# Patient Record
Sex: Female | Born: 1960 | Race: White | Hispanic: No | Marital: Married | Smoking: Never smoker
Health system: Southern US, Community
[De-identification: ages and names within clinical notes are randomized; demographics above are authoritative.]

## PROBLEM LIST (undated history)

## (undated) DIAGNOSIS — E785 Hyperlipidemia, unspecified: Secondary | ICD-10-CM

## (undated) DIAGNOSIS — C801 Malignant (primary) neoplasm, unspecified: Secondary | ICD-10-CM

## (undated) DIAGNOSIS — Z8632 Personal history of gestational diabetes: Secondary | ICD-10-CM

## (undated) HISTORY — DX: Hyperlipidemia, unspecified: E78.5

## (undated) HISTORY — PX: VAGINAL HYSTERECTOMY: SUR661

## (undated) HISTORY — DX: Malignant (primary) neoplasm, unspecified: C80.1

## (undated) HISTORY — DX: Personal history of gestational diabetes: Z86.32

## (undated) HISTORY — PX: MOLE REMOVAL: SHX2046

---

## 2001-04-14 ENCOUNTER — Other Ambulatory Visit: Admission: RE | Admit: 2001-04-14 | Discharge: 2001-04-14 | Payer: Self-pay | Admitting: Gynecology

## 2003-05-09 ENCOUNTER — Other Ambulatory Visit: Admission: RE | Admit: 2003-05-09 | Discharge: 2003-05-09 | Payer: Self-pay | Admitting: Gynecology

## 2003-05-17 ENCOUNTER — Encounter: Admission: RE | Admit: 2003-05-17 | Discharge: 2003-05-17 | Payer: Self-pay | Admitting: Gynecology

## 2003-05-17 ENCOUNTER — Encounter: Payer: Self-pay | Admitting: Gynecology

## 2004-05-11 ENCOUNTER — Other Ambulatory Visit: Admission: RE | Admit: 2004-05-11 | Discharge: 2004-05-11 | Payer: Self-pay | Admitting: Gynecology

## 2004-06-01 ENCOUNTER — Encounter: Admission: RE | Admit: 2004-06-01 | Discharge: 2004-06-01 | Payer: Self-pay | Admitting: Gynecology

## 2004-11-27 ENCOUNTER — Ambulatory Visit: Payer: Self-pay | Admitting: Sports Medicine

## 2004-12-01 ENCOUNTER — Ambulatory Visit: Payer: Self-pay | Admitting: Sports Medicine

## 2005-05-31 ENCOUNTER — Other Ambulatory Visit: Admission: RE | Admit: 2005-05-31 | Discharge: 2005-05-31 | Payer: Self-pay | Admitting: Gynecology

## 2005-07-08 ENCOUNTER — Encounter: Admission: RE | Admit: 2005-07-08 | Discharge: 2005-07-08 | Payer: Self-pay | Admitting: Gynecology

## 2006-01-23 HISTORY — PX: LAPAROSCOPIC VAGINAL HYSTERECTOMY: SUR798

## 2006-02-17 ENCOUNTER — Ambulatory Visit (HOSPITAL_COMMUNITY): Admission: RE | Admit: 2006-02-17 | Discharge: 2006-02-18 | Payer: Self-pay | Admitting: Gynecology

## 2006-02-17 ENCOUNTER — Encounter (INDEPENDENT_AMBULATORY_CARE_PROVIDER_SITE_OTHER): Payer: Self-pay | Admitting: Specialist

## 2006-08-02 ENCOUNTER — Encounter: Admission: RE | Admit: 2006-08-02 | Discharge: 2006-08-02 | Payer: Self-pay | Admitting: Gynecology

## 2006-11-29 ENCOUNTER — Other Ambulatory Visit: Admission: RE | Admit: 2006-11-29 | Discharge: 2006-11-29 | Payer: Self-pay | Admitting: Gynecology

## 2007-07-11 ENCOUNTER — Encounter: Admission: RE | Admit: 2007-07-11 | Discharge: 2007-07-25 | Payer: Self-pay | Admitting: Family Medicine

## 2007-11-20 ENCOUNTER — Encounter: Admission: RE | Admit: 2007-11-20 | Discharge: 2007-11-20 | Payer: Self-pay | Admitting: Gynecology

## 2007-12-04 ENCOUNTER — Other Ambulatory Visit: Admission: RE | Admit: 2007-12-04 | Discharge: 2007-12-04 | Payer: Self-pay | Admitting: Gynecology

## 2008-11-21 ENCOUNTER — Encounter: Admission: RE | Admit: 2008-11-21 | Discharge: 2008-11-21 | Payer: Self-pay | Admitting: Gynecology

## 2008-12-09 ENCOUNTER — Encounter: Payer: Self-pay | Admitting: Gynecology

## 2008-12-09 ENCOUNTER — Ambulatory Visit: Payer: Self-pay | Admitting: Gynecology

## 2008-12-09 ENCOUNTER — Other Ambulatory Visit: Admission: RE | Admit: 2008-12-09 | Discharge: 2008-12-09 | Payer: Self-pay | Admitting: Gynecology

## 2009-07-15 ENCOUNTER — Ambulatory Visit: Payer: Self-pay | Admitting: Gynecology

## 2009-11-27 ENCOUNTER — Encounter: Admission: RE | Admit: 2009-11-27 | Discharge: 2009-11-27 | Payer: Self-pay | Admitting: Gynecology

## 2010-01-28 ENCOUNTER — Ambulatory Visit: Payer: Self-pay | Admitting: Gynecology

## 2010-02-05 ENCOUNTER — Other Ambulatory Visit: Admission: RE | Admit: 2010-02-05 | Discharge: 2010-02-05 | Payer: Self-pay | Admitting: Gynecology

## 2010-02-05 ENCOUNTER — Ambulatory Visit: Payer: Self-pay | Admitting: Gynecology

## 2010-02-11 ENCOUNTER — Ambulatory Visit: Payer: Self-pay | Admitting: Gynecology

## 2010-08-16 ENCOUNTER — Encounter: Payer: Self-pay | Admitting: Gynecology

## 2010-12-11 NOTE — H&P (Signed)
Jennifer Dougherty, Jennifer Dougherty             ACCOUNT NO.:  [id number]   MEDICAL RECORD NO.:  [id number]          PATIENT TYPE:  AMB   LOCATION:  SDC                           FACILITY:  [facility]   PHYSICIAN:  Ivor Costa. Farrel Gobble, M.D. DATE OF BIRTH:  [birth year]   DATE OF ADMISSION:  02/17/2006  DATE OF DISCHARGE:                                HISTORY & PHYSICAL   PRINCIPAL DIAGNOSIS:  Symptomatic fibroid uterus.   HISTORY OF PRESENT ILLNESS:  The patient is a 50 year old, gravida 2, para 2  with known multi-fibroid uterus who has been controlled a number of years on  Ortho-Novum 135. However, the patient has been followed by ultrasound for  her fibroids.  The patient presented in May of this year with heavy vaginal  bleeding that was breakthrough bleeding on the birth control pill.  This had  been going on for about two weeks before it was treated with Megace and  stopped.  The ultrasound performed showed persistence of the fibroids.  The  patient has large 6 x 4 x 5 cm fibroid that is on the lower segment, upper  cervical, anteriorly and is causing a lot of symptoms regarding the bladder.  She does not have any incontinence, however.  At this point the patient  would prefer to have definitive surgery.  Overall she has nine fibroids, the  largest of which was mentioned above.  She also has another one that is 8.5  x 3.  The remaining fibroids are between 2 and 3 cm.  Her uterus overall  measures 10 x 6 x 10.  Her adnexa were unremarkable.   OB/GYN HISTORY:  Significant for the fibroids.  She had gestational diabetes  in one of her pregnancies.  She delivered two children vaginally.  She has  been on birth control pills since 1986.  Her Pap smears have been normal.   PAST MEDICAL HISTORY:  Negative.   PAST SURGICAL HISTORY:  Negative.   SOCIAL HISTORY:  She is married.  No alcohol or tobacco.  She does drink  caffeine.  She exercises via walking three to four times a day for 20 to 30  minutes.   She has lost approximately 20 pounds [facility] and has  maintained that weight loss.   FAMILY HISTORY:  Negative for gynecologic cancers.   ALLERGIES:  No known drug allergies.   PHYSICAL EXAMINATION:  GENERAL: Well-appearing in no acute distress.  HEART:  Regular rate.  LUNGS:  Clear to auscultation.  ABDOMEN:  Soft, nontender.  GYN:  She has a parous introitus.  The BUS is negative.  Cervix without  gross lesions.  The uterus is bulky and enlarged. The adnexa were not  palpable.  Rectovaginal exam shows some impingement posteriorly.   ASSESSMENT:  Symptomatic fibroids who now presents for definitive surgery.  The patient will present for a laparoscopic-assisted vaginal hysterectomy.  She was given a prescription for Tylox for postoperative pain.  Risks and  benefits of the procedure were reviewed.  All questions were addressed.      Ivor Costa. Farrel Gobble, M.D.  Electronically Signed  THL/MEDQ  D:  02/16/2006  T:  02/16/2006  Job:  [id number]

## 2010-12-11 NOTE — Op Note (Signed)
NAMESPIRIT, WERNLI             ACCOUNT NO.:  [id number]   MEDICAL RECORD NO.:  [id number]          PATIENT TYPE:  AMB   LOCATION:  SDC                           FACILITY:  [facility]   PHYSICIAN:  Ivor Costa. Farrel Gobble, M.D. DATE OF BIRTH:  August 02, 1960   DATE OF PROCEDURE:  02/17/2006  DATE OF DISCHARGE:                                 OPERATIVE REPORT   PREOPERATIVE DIAGNOSIS:  Symptomatic fibroid uterus.   POSTOPERATIVE DIAGNOSIS:  Symptomatic fibroid uterus.   PROCEDURE:  Laparoscopically assisted vaginal hysterectomy.   SURGEON:  Ivor Costa. Farrel Gobble, M.D.   ASSISTANT:  Gaetano Hawthorne. Lily Peer, M.D.   ANESTHESIA:  General.   IV FLUIDS:  2200 mL lactated Ringer's.   ESTIMATED BLOOD LOSS:  150 mL.   URINE OUTPUT:  50 mL of clear urine.   FINDINGS:  Multifibroid uterus with approximately 6 cm right submucosal  fibroid.  Normal tubes and ovaries.   COMPLICATIONS:  None.   PROCEDURE:  The patient was taken to operating room.  General anesthesia was  induced and placed in dorsal lithotomy position, prepped, draped usual  sterile fashion.  A bivalve speculum was placed in the vagina.  Cervix was  visualized, stabilized with a uterine manipulator.  Gloves were then changed  and attention was turned to the abdomen.  Infraumbilical incision was made  with the scalpel.  Veress needle was inserted.  Opening pressure was 4.  Pneumoperitoneum was created until tympany was appreciated above the liver  after which 10/11 disposable trocar was inserted through the infraumbilical  port.  Placement in the abdomen was confirmed.  The uterus was placed in  Trendelenburg position and two 5 mm ports were placed in the lower segment  using these 5 mm ports, the uterus was deviated towards the right, the round  ligament was visualized, cauterized and then transected with the gyrus.  The  posterior leaf of the broad ligament was similarly treated to until the tubo-  ovarian ligament was reached and similarly  transected and suture ligated.  The dissection was carried through slightly anteriorly as well.  The uterus  was then rotated towards the left and the round posterior broad ligament and  tubo-ovarian ligament were similarly treated.  Visualization on the right  was somewhat obscured secondary to the large fibroid.  From the left-hand  side, the anterior peritoneum was tented up and the bladder flap was  created.  This was brought around to the right-hand side. Visualization on  the right was limited secondary to the fibroid and therefore we decided to  convert the procedure to vaginal.  The legs were elevated with careful  attention to avoid over flexion on the anterior abdominal wall.  The sterile  weighted speculum was placed in the vagina.  Cervix was visualized.  The  uterine manipulator previously placed was removed and a Jacobs tenaculum was  placed.  The vaginal mucosa was then injected with 8 mL of 1% lidocaine with  epi circumferentially and the vagina was then scored circumferentially with  the scalpel.  The vaginal mucosa was then dissected off.  The cervix was  deviated anteriorly and posterior colpotomy was performed sharply.  A long  sterile weighted speculum was then placed in the vagina.  The uterosacral  ligaments were then transected and suture ligated with 0 Vicryl and held for  later identification.  The vaginal mucosa continued to be dissected off.  The cardinal ligaments were then suture transected and suture ligated with 0  Vicryl and this was again done bilaterally.  The uterine vessels were able  to be reached, transected and suture ligated again bilaterally.  The  anterior colpotomy was reached and the long retractor was then placed.  A  few more bites of the uterines were then taken.  It was evident that the  uterus itself had been freed of all pedicles as we had reached the char  marks from above.  The uterus was unable to be delivered, however and  morcellation  ensued and after some coring out of the large fibroid on the  right-hand side, the uterus was able to be delivered and passed off the  field.  That was weighed at 281 grams. A shorter sterile weighted speculum  was placed in the vagina.  The bowels were packed away with moist lap  sponge.  An area of bleeding on the right-hand side was treated with a  figure-of-eight of 0 Vicryl.  The posterior peritoneum was then grasped with  an Allis and held against the posterior vagina.  The peritoneum was then  plicated to the vagina from above the uterosacral to above the uterosacral.  There was some bleeding noted at the uterosacral ligament on the right hand  side which was treated with a figure-of-eight.  The pelvis was irrigated  again.  Hemostasis was assured.  The towel sponge was then removed.  The  vaginal mucosa was grasped and visualized and noted to be hemostatic.  The  vaginal mucosa was then plicated anterior to posterior with 0 Vicryl pop  offs and hemostasis was felt to be secured throughout.  The pneumoperitoneum  was then recreated from above.  The laparoscope was then inserted.  The  pelvis was noted to be hemostatic.  Irrigation ensued and hemostasis was  assured.  The ureters were able then to be visualized better and peristalsis  of both ureters was seen at the end of the procedure.  The instruments were  then removed under direct visualization.  The infraumbilical fascia was  closed with 0 Vicryl.  The skin was closed with 3-0 plain.  The lower ports  were closed with Steri-Strips.  The ports were injected with 10 mL of 0.25%  Marcaine.  The patient tolerated procedure well.  Sponge, lap and needle  counts correct x2.  She was transferred to the PACU in stable condition.      Ivor Costa. Farrel Gobble, M.D.  Electronically Signed     THL/MEDQ  D:  02/17/2006  T:  02/17/2006  Job:  [id number]

## 2010-12-11 NOTE — Discharge Summary (Signed)
NAMESAMAR, VENNEMAN             ACCOUNT NO.:  [id number]   MEDICAL RECORD NO.:  [id number]          PATIENT TYPE:  OIB   LOCATION:  9308                          FACILITY:  [facility]   PHYSICIAN:  Ivor Costa. Farrel Gobble, M.D. DATE OF BIRTH:  08/20/1960   DATE OF ADMISSION:  02/17/2006  DATE OF DISCHARGE:                                 DISCHARGE SUMMARY   PRINCIPAL DIAGNOSIS:  Symptomatic fibroid uterus.   PRINCIPAL PROCEDURE:  Laparoscopic-assisted vaginal hysterectomy.   HOSPITAL COURSE:  The patient presented in the morning of July 26 and  underwent an LAVH for symptomatic fibroid uterus with delivery of a  multifibroid uterus and cervix with an intraoperative weight of 280 grams.  The estimated blood loss was approximately 150 mL.  The patient was then  extubated in the OR and transferred to the PACU and then postop floor in due  fashion.  Her postoperative course was unremarkable.  On the evening of  surgery, the patient was having some nausea; however, she was able  totolerate po and was otherwise without complaints.  By postop day #1, the  patient was feeling well, ambulating, tolerating regular diet and was having  minimal to no vaginal bleeding.  She remained afebrile with vitals stable  throughout.  Her abdomen was soft, nontender.  Her incisions were intact.  Her extremities were nontender.  Her postop labs:  Her hemoglobin was 10.7,  hematocrit was 30.7, her white count was 13.7 and platelets were 277.   DISCHARGE CONDITION:  Stable.   DISCHARGE MEDICATIONS:  The patient will be given Tylox for pain management  preoperatively.  She was also instructed to use over-the-counter Motrin and  Surfak for constipation.   The patient was instructed to follow up in the office in two weeks.      Ivor Costa. Farrel Gobble, M.D.  Electronically Signed     THL/MEDQ  D:  02/18/2006  T:  02/18/2006  Job:  [id number]

## 2011-01-18 ENCOUNTER — Other Ambulatory Visit: Payer: Self-pay | Admitting: Gynecology

## 2011-01-18 DIAGNOSIS — Z1231 Encounter for screening mammogram for malignant neoplasm of breast: Secondary | ICD-10-CM

## 2011-02-10 ENCOUNTER — Ambulatory Visit
Admission: RE | Admit: 2011-02-10 | Discharge: 2011-02-10 | Disposition: A | Discharge status: Home / Self Care | Payer: 59 | Source: Ambulatory Visit | Attending: Gynecology | Admitting: Gynecology

## 2011-02-10 DIAGNOSIS — Z1231 Encounter for screening mammogram for malignant neoplasm of breast: Secondary | ICD-10-CM

## 2011-02-15 ENCOUNTER — Other Ambulatory Visit: Payer: Self-pay

## 2011-02-19 ENCOUNTER — Ambulatory Visit (INDEPENDENT_AMBULATORY_CARE_PROVIDER_SITE_OTHER): Payer: 59 | Admitting: *Deleted

## 2011-02-19 DIAGNOSIS — Z13220 Encounter for screening for lipoid disorders: Secondary | ICD-10-CM

## 2011-02-19 DIAGNOSIS — Z01419 Encounter for gynecological examination (general) (routine) without abnormal findings: Secondary | ICD-10-CM

## 2011-02-20 LAB — COMPREHENSIVE METABOLIC PANEL
ALT: 8 U/L (ref 0–35)
CO2: 25 mEq/L (ref 19–32)
Calcium: 9.6 mg/dL (ref 8.4–10.5)
Chloride: 103 mEq/L (ref 96–112)
Creat: 0.58 mg/dL (ref 0.50–1.10)
Glucose, Bld: 96 mg/dL (ref 70–99)
Total Bilirubin: 0.4 mg/dL (ref 0.3–1.2)
Total Protein: 7.2 g/dL (ref 6.0–8.3)

## 2011-02-25 ENCOUNTER — Encounter: Payer: Self-pay | Admitting: Gynecology

## 2011-02-25 ENCOUNTER — Other Ambulatory Visit (HOSPITAL_COMMUNITY)
Admission: RE | Admit: 2011-02-25 | Discharge: 2011-02-25 | Disposition: A | Discharge status: Home / Self Care | Payer: 59 | Source: Ambulatory Visit | Attending: Gynecology | Admitting: Gynecology

## 2011-02-25 ENCOUNTER — Ambulatory Visit (INDEPENDENT_AMBULATORY_CARE_PROVIDER_SITE_OTHER): Payer: 59 | Admitting: Gynecology

## 2011-02-25 VITALS — BP 110/70 | Ht 65.0 in | Wt 146.0 lb

## 2011-02-25 DIAGNOSIS — R141 Gas pain: Secondary | ICD-10-CM

## 2011-02-25 DIAGNOSIS — Z1211 Encounter for screening for malignant neoplasm of colon: Secondary | ICD-10-CM

## 2011-02-25 DIAGNOSIS — R140 Abdominal distension (gaseous): Secondary | ICD-10-CM

## 2011-02-25 DIAGNOSIS — I8390 Asymptomatic varicose veins of unspecified lower extremity: Secondary | ICD-10-CM

## 2011-02-25 DIAGNOSIS — Z01419 Encounter for gynecological examination (general) (routine) without abnormal findings: Secondary | ICD-10-CM

## 2011-02-25 DIAGNOSIS — N951 Menopausal and female climacteric states: Secondary | ICD-10-CM

## 2011-02-25 NOTE — Progress Notes (Signed)
Addended by: Bertram Savin A on: 02/25/2011 10:06 AM   Modules accepted: Orders

## 2011-02-25 NOTE — Progress Notes (Signed)
Jennifer Dougherty [birth year] [id number]   History:    50 y.o.  for annual exam with several issues to discuss today. She's been complaining of painful lower extremity varicose veins. Also bloating sensation and lower abdomen. She does have a history of an LAVH in the past. Her colonoscopy was done in 2009 which was normal she does have family history of colon cancer. From August to December 2012 she was on Prilosec for GERD and had an upper endoscopy as well. Her last mammogram was July 2004 which was normal. She frequently does result has examination. All her labs were done here on July 27 which consisted of a CBC currently is metabolic panel and lipid profile and urinalysis which were all were normal. She is complaining of occasional vasomotor symptoms.  Past medical history,surgical history, family history and social history were all reviewed and documented in the EPIC chart. ROS:  Was performed and pertinent positives and negatives are included in the history.  Exam: chaperone present Filed Vitals:   02/25/11 0852  BP: 110/70   Body mass index is 24.30 kg/(m^2).  General appearance : Well developed well nourished female. Skin grossly normal HEENT: Neck supple, trachea midline Lungs: Clear to auscultation, no rhonchi or wheezes Heart: Regular rate and rhythm, no murmurs or gallops Breast:Examined in sitting and supine position were symmetrical in appearance, no palpable masses, to skin retraction, no nipple inversion, no nipple discharge and no axillary or supraclavicular lymphadenopathy Abdomen: no palpable masses or tenderness Pelvic  Ext/BUS/vagina  normal   Cervix  normal   Uterus  absent Adnexa  Without masses or tenderness  Anus and perineum  normal Hemoccult testing done  Rectovaginal  normal sphincter tone without palpated masses or tenderness. 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 female for annual exam the patient will have an FSH  drawn today because of her vasomotor symptoms. She'll be scheduled for an ultrasound next week as a result of her symptoms of feeling bloated and lower abdominal discomfort. Will notify her of there is any abnormality on the [facility] and a Hemoccult test and we'll see her back next week. We discussed importance of calcium and vitamin D for osteoporosis prevention.    Ok Edwards MD, 9:39 AM 02/25/2011

## 2011-02-25 NOTE — Patient Instructions (Signed)
Please remember to take her calcium and vitamin D twice a day as discussed in the office visit today. We'll discuss the results of your NEXT WEEK. PLEASE CONTINUE TO DO MONTHLY SELF BREAST EXAMINATION. WE WILL ALSO DISCUSS THE RESULTS OF THE FSH NEXT WEEK AS WELL.

## 2011-03-08 ENCOUNTER — Other Ambulatory Visit: Payer: 59

## 2011-03-08 ENCOUNTER — Ambulatory Visit (INDEPENDENT_AMBULATORY_CARE_PROVIDER_SITE_OTHER): Payer: 59 | Admitting: Gynecology

## 2011-03-08 DIAGNOSIS — R140 Abdominal distension (gaseous): Secondary | ICD-10-CM | POA: Insufficient documentation

## 2011-03-08 DIAGNOSIS — I83893 Varicose veins of bilateral lower extremities with other complications: Secondary | ICD-10-CM

## 2011-03-08 DIAGNOSIS — I83813 Varicose veins of bilateral lower extremities with pain: Secondary | ICD-10-CM | POA: Insufficient documentation

## 2011-03-08 DIAGNOSIS — R141 Gas pain: Secondary | ICD-10-CM

## 2011-03-08 DIAGNOSIS — N949 Unspecified condition associated with female genital organs and menstrual cycle: Secondary | ICD-10-CM

## 2011-03-08 NOTE — Progress Notes (Signed)
Patient presented to the office today to discuss her ultrasound. She was seen in the office on August 2 for her annual gynecological examination. She was complaining of low abdominal bloating so an ultrasound was done today. I was happy to inform her that her ultrasound demonstrated that her ovaries were normal, she has a history of prior hysterectomy. Her recent Pap smear ,mammogram and blood work were all normal. If she continues to have the bloating symptom she'll followup with her gastroenterologist. She does state that she's having normal bowel movements. She was asymptomatic today with the exception of the bilateral lower extremity varicosities for which I had referred her to the vein specialist for further assessment and management because they're causing her discomfort.

## 2011-04-14 ENCOUNTER — Encounter: Payer: Self-pay | Admitting: Gynecology

## 2011-05-04 ENCOUNTER — Encounter: Payer: Self-pay | Admitting: *Deleted

## 2012-01-03 ENCOUNTER — Other Ambulatory Visit: Payer: Self-pay | Admitting: Gynecology

## 2012-01-03 DIAGNOSIS — Z1231 Encounter for screening mammogram for malignant neoplasm of breast: Secondary | ICD-10-CM

## 2012-02-15 ENCOUNTER — Ambulatory Visit: Payer: 59

## 2012-03-13 ENCOUNTER — Encounter: Payer: Self-pay | Admitting: Gynecology

## 2012-03-13 ENCOUNTER — Ambulatory Visit (INDEPENDENT_AMBULATORY_CARE_PROVIDER_SITE_OTHER): Payer: 59

## 2012-03-13 ENCOUNTER — Ambulatory Visit (INDEPENDENT_AMBULATORY_CARE_PROVIDER_SITE_OTHER): Payer: 59 | Admitting: Gynecology

## 2012-03-13 VITALS — BP 112/68 | Ht 65.5 in | Wt 162.0 lb

## 2012-03-13 DIAGNOSIS — Z23 Encounter for immunization: Secondary | ICD-10-CM

## 2012-03-13 DIAGNOSIS — N83209 Unspecified ovarian cyst, unspecified side: Secondary | ICD-10-CM

## 2012-03-13 DIAGNOSIS — N942 Vaginismus: Secondary | ICD-10-CM

## 2012-03-13 DIAGNOSIS — Z8632 Personal history of gestational diabetes: Secondary | ICD-10-CM

## 2012-03-13 DIAGNOSIS — R635 Abnormal weight gain: Secondary | ICD-10-CM

## 2012-03-13 DIAGNOSIS — N949 Unspecified condition associated with female genital organs and menstrual cycle: Secondary | ICD-10-CM

## 2012-03-13 DIAGNOSIS — N831 Corpus luteum cyst of ovary, unspecified side: Secondary | ICD-10-CM

## 2012-03-13 DIAGNOSIS — I8390 Asymptomatic varicose veins of unspecified lower extremity: Secondary | ICD-10-CM

## 2012-03-13 DIAGNOSIS — R1031 Right lower quadrant pain: Secondary | ICD-10-CM

## 2012-03-13 DIAGNOSIS — Z01419 Encounter for gynecological examination (general) (routine) without abnormal findings: Secondary | ICD-10-CM

## 2012-03-13 DIAGNOSIS — Z800 Family history of malignant neoplasm of digestive organs: Secondary | ICD-10-CM | POA: Insufficient documentation

## 2012-03-13 LAB — GLUCOSE, RANDOM: Glucose, Bld: 82 mg/dL (ref 70–99)

## 2012-03-13 LAB — CBC WITH DIFFERENTIAL/PLATELET
Eosinophils Relative: 1 % (ref 0–5)
HCT: 40 % (ref 36.0–46.0)
Hemoglobin: 13.6 g/dL (ref 12.0–15.0)
Lymphocytes Relative: 21 % (ref 12–46)
Lymphs Abs: 2.5 10*3/uL (ref 0.7–4.0)
MCV: 85.3 fL (ref 78.0–100.0)
Monocytes Absolute: 1 10*3/uL (ref 0.1–1.0)
Monocytes Relative: 9 % (ref 3–12)
Neutro Abs: 7.8 10*3/uL — ABNORMAL HIGH (ref 1.7–7.7)
RDW: 13.4 % (ref 11.5–15.5)
WBC: 11.5 10*3/uL — ABNORMAL HIGH (ref 4.0–10.5)

## 2012-03-13 LAB — LIPID PANEL
Cholesterol: 204 mg/dL — ABNORMAL HIGH (ref 0–200)
Total CHOL/HDL Ratio: 2.8 Ratio

## 2012-03-13 LAB — TSH: TSH: 1.394 u[IU]/mL (ref 0.350–4.500)

## 2012-03-13 MED ORDER — FLUCONAZOLE 100 MG PO TABS: ORAL_TABLET | ORAL | Status: DC

## 2012-03-13 NOTE — Progress Notes (Signed)
Jennifer Dougherty August 29, 1960 [id number]   History:    51 y.o.  for annual gyn exam who is only complaint has been her varicose veins was cause her discomfort. Also she's been concerned about weight gain. Review of her retina indicated she had an LAVH several years ago. She does have history of gestational diabetes. Her mother and maternal grandmother both have history of colon cancer and her colonoscopy was normal in 2009. She scheduled for mammogram this week. She does her monthly self was examination. She's currently taking calcium and vitamin D daily. Her last Pap smear was normal in 2012. She does not recall ever having received a deep Vaccine. Review of her record again she was weighing 146 at the 162.  Past medical history,surgical history, family history and social history were all reviewed and documented in the EPIC chart.  Gynecologic History No LMP recorded. Patient has had a hysterectomy. Contraception: Hysterectomy Last Pap: 2012. Results were: normal Last mammogram: 2012. Results were: normal  Obstetric History OB History    Grav Para Term Preterm Abortions TAB SAB Ect Mult Living   2 2        2      # Outc Date GA Lbr Len/2nd Wgt Sex Del Anes PTL Lv   1 PAR            2 PAR                ROS: A ROS was performed and pertinent positives and negatives are included in the history.  GENERAL: No fevers or chills. HEENT: No change in vision, no earache, sore throat or sinus congestion. NECK: No pain or stiffness. CARDIOVASCULAR: No chest pain or pressure. No palpitations. PULMONARY: No shortness of breath, cough or wheeze. GASTROINTESTINAL: No abdominal pain, nausea, vomiting or diarrhea, melena or bright red blood per rectum. GENITOURINARY: No urinary frequency, urgency, hesitancy or dysuria. MUSCULOSKELETAL: No joint or muscle pain, no back pain, no recent trauma. DERMATOLOGIC: No rash, no itching, no lesions. ENDOCRINE: No polyuria, polydipsia, no heat or cold intolerance. No  recent change in weight. HEMATOLOGICAL: No anemia or easy bruising or bleeding. NEUROLOGIC: No headache, seizures, numbness, tingling or weakness. PSYCHIATRIC: No depression, no loss of interest in normal activity or change in sleep pattern.     Exam: chaperone present  BP 112/68  Ht 5' 5.5" (1.664 m)  Wt 162 lb (73.483 kg)  BMI 26.55 kg/m2  Body mass index is 26.55 kg/(m^2).  General appearance : Well developed well nourished female. No acute distress HEENT: Neck supple, trachea midline, no carotid bruits, no thyroidmegaly Lungs: Clear to auscultation, no rhonchi or wheezes, or rib retractions  Heart: Regular rate and rhythm, no murmurs or gallops Breast:Examined in sitting and supine position were symmetrical in appearance, no palpable masses or tenderness,  no skin retraction, no nipple inversion, no nipple discharge, no skin discoloration, no axillary or supraclavicular lymphadenopathy Abdomen: no palpable masses or tenderness, no rebound or guarding Extremities: no edema or skin discoloration or tenderness  Pelvic:  Bartholin, Urethra, Skene Glands: Within normal limits             Vagina: No gross lesions or discharge  Cervix: Absent Uterus  absent  Adnexa  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 Left ovary was otherwise normal no fluid in the cul-de-sac.    Assessment/Plan:  51 y.o. female for annual exam who had an ultrasound as a result of patient's vaginismus demonstrated a small right ovarian cyst measuring 27 x 21 x 20 mm probably corpus luteum cyst. She'll return back to the office in the beginning of the  year for followup ultrasound. Patient did receive the DTaP vaccine today. The following labs were drawn today: CBC, fasting lipid profile, urinalysis, and random blood sugar. New Pap smear screening guidelines discussed. She will no longer needs Pap smears since she's had a hysterectomy and has had normal Pap smears over the course of the past 20 years. She was instructed to continue to do her monthly self breast examination. We discussed importance of calcium and vitamin D for osteoporosis prevention. We discussed importance of weightbearing exercises 45 minutes 3 times a week.   Ok Edwards MD, 10:03 AM 03/13/2012

## 2012-03-13 NOTE — Patient Instructions (Addendum)
Health Maintenance, Females A healthy lifestyle and preventative care can promote health and wellness.  Maintain regular health, dental, and eye exams.   Eat a healthy diet. Foods like vegetables, fruits, whole grains, low-fat dairy products, and lean protein foods contain the nutrients you need without too many calories. Decrease your intake of foods high in solid fats, added sugars, and salt. Get information about a proper diet from your caregiver, if necessary.   Regular physical exercise is one of the most important things you can do for your health. Most adults should get at least 150 minutes of moderate-intensity exercise (any activity that increases your heart rate and causes you to sweat) each week. In addition, most adults need muscle-strengthening exercises on 2 or more days a week.    Maintain a healthy weight. The body mass index (BMI) is a screening tool to identify possible weight problems. It provides an estimate of body fat based on height and weight. Your caregiver can help determine your BMI, and can help you achieve or maintain a healthy weight. For adults 20 years and older:   A BMI below 18.5 is considered underweight.   A BMI of 18.5 to 24.9 is normal.   A BMI of 25 to 29.9 is considered overweight.   A BMI of 30 and above is considered obese.   Maintain normal blood lipids and cholesterol by exercising and minimizing your intake of saturated fat. Eat a balanced diet with plenty of fruits and vegetables. Blood tests for lipids and cholesterol should begin at age 20 and be repeated every 5 years. If your lipid or cholesterol levels are high, you are over 50, or you are a high risk for heart disease, you may need your cholesterol levels checked more frequently.Ongoing high lipid and cholesterol levels should be treated with medicines if diet and exercise are not effective.   If you smoke, find out from your caregiver how to quit. If you do not use tobacco, do not start.    If you are pregnant, do not drink alcohol. If you are breastfeeding, be very cautious about drinking alcohol. If you are not pregnant and choose to drink alcohol, do not exceed 1 drink per day. One drink is considered to be 12 ounces (355 mL) of beer, 5 ounces (148 mL) of wine, or 1.5 ounces (44 mL) of liquor.   Avoid use of street drugs. Do not share needles with anyone. Ask for help if you need support or instructions about stopping the use of drugs.   High blood pressure causes heart disease and increases the risk of stroke. Blood pressure should be checked at least every 1 to 2 years. Ongoing high blood pressure should be treated with medicines, if weight loss and exercise are not effective.   If you are 55 to 49 years old, ask your caregiver if you should take aspirin to prevent strokes.   Diabetes screening involves taking a blood sample to check your fasting blood sugar level. This should be done once every 3 years, after age 45, if you are within normal weight and without risk factors for diabetes. Testing should be considered at a younger age or be carried out more frequently if you are overweight and have at least 1 risk factor for diabetes.   Breast cancer screening is essential preventative care for women. You should practice "breast self-awareness." This means understanding the normal appearance and feel of your breasts and may include breast self-examination. Any changes detected, no matter how   small, should be reported to a caregiver. Women in their 20s and 30s should have a clinical breast exam (CBE) by a caregiver as part of a regular health exam every 1 to 3 years. After age 40, women should have a CBE every year. Starting at age 40, women should consider having a mammogram (breast X-ray) every year. Women who have a family history of breast cancer should talk to their caregiver about genetic screening. Women at a high risk of breast cancer should talk to their caregiver about having  an MRI and a mammogram every year.   The Pap test is a screening test for cervical cancer. Women should have a Pap test starting at age 21. Between ages 21 and 29, Pap tests should be repeated every 2 years. Beginning at age 30, you should have a Pap test every 3 years as long as the past 3 Pap tests have been normal. If you had a hysterectomy for a problem that was not cancer or a condition that could lead to cancer, then you no longer need Pap tests. If you are between ages 65 and 70, and you have had normal Pap tests going back 10 years, you no longer need Pap tests. If you have had past treatment for cervical cancer or a condition that could lead to cancer, you need Pap tests and screening for cancer for at least 20 years after your treatment. If Pap tests have been discontinued, risk factors (such as a new sexual partner) need to be reassessed to determine if screening should be resumed. Some women have medical problems that increase the chance of getting cervical cancer. In these cases, your caregiver may recommend more frequent screening and Pap tests.   The human papillomavirus (HPV) test is an additional test that may be used for cervical cancer screening. The HPV test looks for the virus that can cause the cell changes on the cervix. The cells collected during the Pap test can be tested for HPV. The HPV test could be used to screen women aged 30 years and older, and should be used in women of any age who have unclear Pap test results. After the age of 30, women should have HPV testing at the same frequency as a Pap test.   Colorectal cancer can be detected and often prevented. Most routine colorectal cancer screening begins at the age of 50 and continues through age 75. However, your caregiver may recommend screening at an earlier age if you have risk factors for colon cancer. On a yearly basis, your caregiver may provide home test kits to check for hidden blood in the stool. Use of a small camera at  the end of a tube, to directly examine the colon (sigmoidoscopy or colonoscopy), can detect the earliest forms of colorectal cancer. Talk to your caregiver about this at age 50, when routine screening begins. Direct examination of the colon should be repeated every 5 to 10 years through age 75, unless early forms of pre-cancerous polyps or small growths are found.   Hepatitis C blood testing is recommended for all people born from 1945 through 1965 and any individual with known risks for hepatitis C.   Practice safe sex. Use condoms and avoid high-risk sexual practices to reduce the spread of sexually transmitted infections (STIs). Sexually active women aged 25 and younger should be checked for Chlamydia, which is a common sexually transmitted infection. Older women with new or multiple partners should also be tested for Chlamydia. Testing for other   STIs is recommended if you are sexually active and at increased risk.   Osteoporosis is a disease in which the bones lose minerals and strength with aging. This can result in serious bone fractures. The risk of osteoporosis can be identified using a bone density scan. Women ages 50 and over and women at risk for fractures or osteoporosis should discuss screening with their caregivers. Ask your caregiver whether you should be taking a calcium supplement or vitamin D to reduce the rate of osteoporosis.   Menopause can be associated with physical symptoms and risks. Hormone replacement therapy is available to decrease symptoms and risks. You should talk to your caregiver about whether hormone replacement therapy is right for you.   Use sunscreen with a sun protection factor (SPF) of 30 or greater. Apply sunscreen liberally and repeatedly throughout the day. You should seek shade when your shadow is shorter than you. Protect yourself by wearing long sleeves, pants, a wide-brimmed hat, and sunglasses year round, whenever you are outdoors.   Notify your caregiver  of new moles or changes in moles, especially if there is a change in shape or color. Also notify your caregiver if a mole is larger than the size of a pencil eraser.   Stay current with your immunizations.  Document Released: 01/25/2011 Document Revised: 07/01/2011 Document Reviewed: 01/25/2011 [facility] Patient Information 2012 [place], [place].                                                   Cholesterol Control Diet  Cholesterol levels in your body are determined significantly by your diet. Cholesterol levels may also be related to heart disease. The following material helps to explain this relationship and discusses what you can do to help keep your heart healthy. Not all cholesterol is bad. Low-density lipoprotein (LDL) cholesterol is the "bad" cholesterol. It may cause fatty deposits to build up inside your arteries. High-density lipoprotein (HDL) cholesterol is "good." It helps to remove the "bad" LDL cholesterol from your blood. Cholesterol is a very important risk factor for heart disease. Other risk factors are high blood pressure, smoking, stress, heredity, and weight. The heart muscle gets its supply of blood through the coronary arteries. If your LDL cholesterol is high and your HDL cholesterol is low, you are at risk for having fatty deposits build up in your coronary arteries. This leaves less room through which blood can flow. Without sufficient blood and oxygen, the heart muscle cannot function properly and you may feel chest pains (angina pectoris). When a coronary artery closes up entirely, a part of the heart muscle may die, causing a heart attack (myocardial infarction). CHECKING CHOLESTEROL When your caregiver sends your blood to a lab to be analyzed for cholesterol, a complete lipid (fat) profile may be done. With this test, the total amount of cholesterol and levels of LDL and HDL are determined. Triglycerides are a type of fat that circulates in the blood and can also be used to  determine heart disease risk. The list below describes what the numbers should be: Test: Total Cholesterol.  Less than 200 mg/dl.  Test: LDL "bad cholesterol."  Less than 100 mg/dl.   Less than 70 mg/dl if you are at very high risk of a heart attack or sudden cardiac death.  Test: HDL "good cholesterol."  Greater than 50 mg/dl for  women.   Greater than 40 mg/dl for men.  Test: Triglycerides.  Less than 150 mg/dl.  CONTROLLING CHOLESTEROL WITH DIET Although exercise and lifestyle factors are important, your diet is key. That is because certain foods are known to raise cholesterol and others to lower it. The goal is to balance foods for their effect on cholesterol and more importantly, to replace saturated and trans fat with other types of fat, such as monounsaturated fat, polyunsaturated fat, and omega-3 fatty acids. On average, a person should consume no more than 15 to 17 g of saturated fat daily. Saturated and trans fats are considered "bad" fats, and they will raise LDL cholesterol. Saturated fats are primarily found in animal products such as meats, butter, and cream. However, that does not mean you need to sacrifice all your favorite foods. Today, there are good tasting, low-fat, low-cholesterol substitutes for most of the things you like to eat. Choose low-fat or nonfat alternatives. Choose round or loin cuts of red meat, since these types of cuts are lowest in fat and cholesterol. Chicken (without the skin), fish, veal, and ground [place] breast are excellent choices. Eliminate fatty meats, such as hot dogs and salami. Even shellfish have little or no saturated fat. Have a 3 oz (85 g) portion when you eat lean meat, poultry, or fish. Trans fats are also called "partially hydrogenated oils." They are oils that have been scientifically manipulated so that they are solid at room temperature resulting in a longer shelf life and improved taste and texture of foods in which they are added. Trans  fats are found in stick margarine, some tub margarines, cookies, crackers, and baked goods.  When baking and cooking, oils are an excellent substitute for butter. The monounsaturated oils are especially beneficial since it is believed they lower LDL and raise HDL. The oils you should avoid entirely are saturated tropical oils, such as coconut and palm.  Remember to eat liberally from food groups that are naturally free of saturated and trans fat, including fish, fruit, vegetables, beans, grains (barley, rice, couscous, bulgur wheat), and pasta (without cream sauces).  IDENTIFYING FOODS THAT LOWER CHOLESTEROL  Soluble fiber may lower your cholesterol. This type of fiber is found in fruits such as apples, vegetables such as broccoli, potatoes, and carrots, legumes such as beans, peas, and lentils, and grains such as barley. Foods fortified with plant sterols (phytosterol) may also lower cholesterol. You should eat at least 2 g per day of these foods for a cholesterol lowering effect.  Read package labels to identify low-saturated fats, trans fats free, and low-fat foods at the supermarket. Select cheeses that have only 2 to 3 g saturated fat per ounce. Use a heart-healthy tub margarine that is free of trans fats or partially hydrogenated oil. When buying baked goods (cookies, crackers), avoid partially hydrogenated oils. Breads and muffins should be made from whole grains (whole-wheat or whole oat flour, instead of "flour" or "enriched flour"). Buy non-creamy canned soups with reduced salt and no added fats.  FOOD PREPARATION TECHNIQUES  Never deep-fry. If you must fry, either stir-fry, which uses very little fat, or use non-stick cooking sprays. When possible, broil, bake, or roast meats, and steam vegetables. Instead of dressing vegetables with butter or margarine, use lemon and herbs, applesauce and cinnamon (for squash and sweet potatoes), nonfat yogurt, salsa, and low-fat dressings for salads.    LOW-SATURATED FAT / LOW-FAT FOOD SUBSTITUTES Meats / Saturated Fat (g)  Avoid: Steak, marbled (3 oz/85 g) /  11 g   Choose: Steak, lean (3 oz/85 g) / 4 g   Avoid: Hamburger (3 oz/85 g) / 7 g   Choose: Hamburger, lean (3 oz/85 g) / 5 g   Avoid: Ham (3 oz/85 g) / 6 g   Choose: Ham, lean cut (3 oz/85 g) / 2.4 g   Avoid: Chicken, with skin, dark meat (3 oz/85 g) / 4 g   Choose: Chicken, skin removed, dark meat (3 oz/85 g) / 2 g   Avoid: Chicken, with skin, light meat (3 oz/85 g) / 2.5 g   Choose: Chicken, skin removed, light meat (3 oz/85 g) / 1 g  Dairy / Saturated Fat (g)  Avoid: Whole milk (1 cup) / 5 g   Choose: Low-fat milk, 2% (1 cup) / 3 g   Choose: Low-fat milk, 1% (1 cup) / 1.5 g   Choose: Skim milk (1 cup) / 0.3 g   Avoid: Hard cheese (1 oz/28 g) / 6 g   Choose: Skim milk cheese (1 oz/28 g) / 2 to 3 g   Avoid: Cottage cheese, 4% fat (1 cup) / 6.5 g   Choose: Low-fat cottage cheese, 1% fat (1 cup) / 1.5 g   Avoid: Ice cream (1 cup) / 9 g   Choose: Sherbet (1 cup) / 2.5 g   Choose: Nonfat frozen yogurt (1 cup) / 0.3 g   Choose: Frozen fruit bar / trace   Avoid: Whipped cream (1 tbs) / 3.5 g   Choose: Nondairy whipped topping (1 tbs) / 1 g  Condiments / Saturated Fat (g)  Avoid: Mayonnaise (1 tbs) / 2 g   Choose: Low-fat mayonnaise (1 tbs) / 1 g   Avoid: Butter (1 tbs) / 7 g   Choose: Extra light margarine (1 tbs) / 1 g   Avoid: Coconut oil (1 tbs) / 11.8 g   Choose: Olive oil (1 tbs) / 1.8 g   Choose: Corn oil (1 tbs) / 1.7 g   Choose: Safflower oil (1 tbs) / 1.2 g   Choose: Sunflower oil (1 tbs) / 1.4 g   Choose: Soybean oil (1 tbs) / 2.4 g   Choose: Canola oil (1 tbs) / 1 g  Document Released: 07/12/2005 Document Revised: 03/24/2011 Document Reviewed: 12/31/2010 [facility] Patient Information 2012 [place], [place].  Exercise to Lose Weight Exercise and a healthy diet may help you lose weight. Your doctor may suggest specific  exercises. EXERCISE IDEAS AND TIPS  Choose low-cost things you enjoy doing, such as walking, bicycling, or exercising to workout videos.   Take stairs instead of the elevator.   Walk during your lunch break.   Park your car further away from work or school.   Go to a gym or an exercise class.   Start with 5 to 10 minutes of exercise each day. Build up to 30 minutes of exercise 4 to 6 days a week.   Wear shoes with good support and comfortable clothes.   Stretch before and after working out.   Work out until you breathe harder and your heart beats faster.   Drink extra water when you exercise.   Do not do so much that you hurt yourself, feel dizzy, or get very short of breath.  Exercises that burn about 150 calories:  Running 1  miles in 15 minutes.   Playing volleyball for 45 to 60 minutes.   Washing and waxing a car for 45 to 60 minutes.   Playing touch football  for 45 minutes.   Walking 1  miles in 35 minutes.   Pushing a stroller 1  miles in 30 minutes.   Playing basketball for 30 minutes.   Raking leaves for 30 minutes.   Bicycling 5 miles in 30 minutes.   Walking 2 miles in 30 minutes.   Dancing for 30 minutes.   Shoveling snow for 15 minutes.   Swimming laps for 20 minutes.   Walking up stairs for 15 minutes.   Bicycling 4 miles in 15 minutes.   Gardening for 30 to 45 minutes.   Jumping rope for 15 minutes.   Washing windows or floors for 45 to 60 minutes.  Document Released: 08/14/2010 Document Revised: 03/24/2011 Document Reviewed: 08/14/2010 [facility] Patient Information 2012 [place], [place].    Ovarian Cyst The ovaries are small organs that are on each side of the uterus. The ovaries are the organs that produce the female hormones, estrogen and progesterone. An ovarian cyst is a sac filled with fluid that can vary in its size. It is normal for a small cyst to form in women who are in the childbearing age and who have menstrual periods.  This type of cyst is called a follicle cyst that becomes an ovulation cyst (corpus luteum cyst) after it produces the women's egg. It later goes away on its own if the woman does not become pregnant. There are other kinds of ovarian cysts that may cause problems and may need to be treated. The most serious problem is a cyst with cancer. It should be noted that menopausal women who have an ovarian cyst are at a higher risk of it being a cancer cyst. They should be evaluated very quickly, thoroughly and followed closely. This is especially true in menopausal women because of the high rate of ovarian cancer in women in menopause. CAUSES AND TYPES OF OVARIAN CYSTS:  FUNCTIONAL CYST: The follicle/corpus luteum cyst is a functional cyst that occurs every month during ovulation with the menstrual cycle. They go away with the next menstrual cycle if the woman does not get pregnant. Usually, there are no symptoms with a functional cyst.   ENDOMETRIOMA CYST: This cyst develops from the lining of the uterus tissue. This cyst gets in or on the ovary. It grows every month from the bleeding during the menstrual period. It is also called a "chocolate cyst" because it becomes filled with blood that turns brown. This cyst can cause pain in the lower abdomen during intercourse and with your menstrual period.   CYSTADENOMA CYST: This cyst develops from the cells on the outside of the ovary. They usually are not cancerous. They can get very big and cause lower abdomen pain and pain with intercourse. This type of cyst can twist on itself, cut off its blood supply and cause severe pain. It also can easily rupture and cause a lot of pain.   DERMOID CYST: This type of cyst is sometimes found in both ovaries. They are found to have different kinds of body tissue in the cyst. The tissue includes skin, teeth, hair, and/or cartilage. They usually do not have symptoms unless they get very big. Dermoid cysts are rarely cancerous.    POLYCYSTIC OVARY: This is a rare condition with hormone problems that produces many small cysts on both ovaries. The cysts are follicle-like cysts that never produce an egg and become a corpus luteum. It can cause an increase in body weight, infertility, acne, increase in body and facial hair and lack of  menstrual periods or rare menstrual periods. Many women with this problem develop type 2 diabetes. The exact cause of this problem is unknown. A polycystic ovary is rarely cancerous.   THECA LUTEIN CYST: Occurs when too much hormone (human chorionic gonadotropin) is produced and over-stimulates the ovaries to produce an egg. They are frequently seen when doctors stimulate the ovaries for invitro-fertilization (test tube babies).   LUTEOMA CYST: This cyst is seen during pregnancy. Rarely it can cause an obstruction to the birth canal during labor and delivery. They usually go away after delivery.  SYMPTOMS   Pelvic pain or pressure.   Pain during sexual intercourse.   Increasing girth (swelling) of the abdomen.   Abnormal menstrual periods.   Increasing pain with menstrual periods.   You stop having menstrual periods and you are not pregnant.  DIAGNOSIS  The diagnosis can be made during:  Routine or annual pelvic examination (common).   Ultrasound.   X-ray of the pelvis.   CT Scan.   MRI.   Blood tests.  TREATMENT   Treatment may only be to follow the cyst monthly for 2 to 3 months with your caregiver. Many go away on their own, especially functional cysts.   May be aspirated (drained) with a long needle with ultrasound, or by laparoscopy (inserting a tube into the pelvis through a small incision).   The whole cyst can be removed by laparoscopy.   Sometimes the cyst may need to be removed through an incision in the lower abdomen.   Hormone treatment is sometimes used to help dissolve certain cysts.   Birth control pills are sometimes used to help dissolve certain cysts.   HOME CARE INSTRUCTIONS  Follow your caregiver's advice regarding:  Medicine.   Follow up visits to evaluate and treat the cyst.   You may need to come back or make an appointment with another caregiver, to find the exact cause of your cyst, if your caregiver is not a gynecologist.   Get your yearly and recommended pelvic examinations and Pap tests.   Let your caregiver know if you have had an ovarian cyst in the past.  SEEK MEDICAL CARE IF:   Your periods are late, irregular, they stop, or are painful.   Your stomach (abdomen) or pelvic pain does not go away.   Your stomach becomes larger or swollen.   You have pressure on your bladder or trouble emptying your bladder completely.   You have painful sexual intercourse.   You have feelings of fullness, pressure, or discomfort in your stomach.   You lose weight for no apparent reason.   You feel generally ill.   You become constipated.   You lose your appetite.   You develop acne.   You have an increase in body and facial hair.   You are gaining weight, without changing your exercise and eating habits.   You think you are pregnant.  SEEK IMMEDIATE MEDICAL CARE IF:   You have increasing abdominal pain.   You feel sick to your stomach (nausea) and/or vomit.   You develop a fever that comes on suddenly.   You develop abdominal pain during a bowel movement.   Your menstrual periods become heavier than usual.  Document Released: 07/12/2005 Document Revised: 07/01/2011 Document Reviewed: 05/15/2009 [facility] Patient Information 2012 [place], [place].

## 2012-03-14 LAB — URINALYSIS W MICROSCOPIC + REFLEX CULTURE
Bacteria, UA: NONE SEEN
Bilirubin Urine: NEGATIVE
Casts: NONE SEEN
Crystals: NONE SEEN
Glucose, UA: NEGATIVE mg/dL
Ketones, ur: NEGATIVE mg/dL
Specific Gravity, Urine: 1.005 (ref 1.005–1.030)
Squamous Epithelial / LPF: NONE SEEN
Urobilinogen, UA: 0.2 mg/dL (ref 0.0–1.0)
pH: 7 (ref 5.0–8.0)

## 2012-03-15 ENCOUNTER — Ambulatory Visit
Admission: RE | Admit: 2012-03-15 | Discharge: 2012-03-15 | Disposition: A | Discharge status: Home / Self Care | Payer: 59 | Source: Ambulatory Visit | Attending: Gynecology | Admitting: Gynecology

## 2012-03-15 DIAGNOSIS — Z1231 Encounter for screening mammogram for malignant neoplasm of breast: Secondary | ICD-10-CM

## 2012-03-16 ENCOUNTER — Other Ambulatory Visit: Payer: Self-pay | Admitting: Gynecology

## 2012-03-16 DIAGNOSIS — D72829 Elevated white blood cell count, unspecified: Secondary | ICD-10-CM

## 2012-03-31 ENCOUNTER — Other Ambulatory Visit: Payer: 59

## 2012-03-31 DIAGNOSIS — D72829 Elevated white blood cell count, unspecified: Secondary | ICD-10-CM

## 2012-03-31 LAB — CBC WITH DIFFERENTIAL/PLATELET
Basophils Absolute: 0.1 10*3/uL (ref 0.0–0.1)
Basophils Relative: 0 % (ref 0–1)
Eosinophils Absolute: 0.2 10*3/uL (ref 0.0–0.7)
Hemoglobin: 12.9 g/dL (ref 12.0–15.0)
MCH: 29.5 pg (ref 26.0–34.0)
MCHC: 34.5 g/dL (ref 30.0–36.0)
Monocytes Relative: 7 % (ref 3–12)
Neutro Abs: 7.8 10*3/uL — ABNORMAL HIGH (ref 1.7–7.7)
Neutrophils Relative %: 66 % (ref 43–77)
RDW: 13.1 % (ref 11.5–15.5)

## 2012-04-03 ENCOUNTER — Other Ambulatory Visit: Payer: Self-pay | Admitting: Gynecology

## 2012-04-05 ENCOUNTER — Telehealth: Payer: Self-pay | Admitting: *Deleted

## 2012-04-05 NOTE — Telephone Encounter (Signed)
Message copied by Aura Camps on Wed Apr 05, 2012  3:40 PM ------      Message from: Keenan Bachelor      Created: Wed Apr 05, 2012  2:37 PM       Dr. 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            Patient cannot talk until after 12:30pm each day at [phone].            Thanks!!!            Dedra Skeens

## 2012-04-05 NOTE — Telephone Encounter (Signed)
New patient referral form filled out and faxed to [facility]. Cancer will contact pt for appointment.

## 2012-04-06 ENCOUNTER — Telehealth: Payer: Self-pay | Admitting: Oncology

## 2012-04-06 NOTE — Telephone Encounter (Signed)
C/D on 9/12 for appt 10/01.

## 2012-04-06 NOTE — Telephone Encounter (Signed)
S/W pt in re NP appt 10/1 @ 1:30 w/ Dr. Clelia Croft.  Scheduled per pt she states she will be out of town.  Referring Dr. Lily Peer Dx-Hx of elevated WBC Np packet mail

## 2012-04-07 NOTE — Telephone Encounter (Signed)
Appointment on 04/25/12 @ 1:30 pm

## 2012-04-20 ENCOUNTER — Other Ambulatory Visit: Payer: Self-pay | Admitting: Oncology

## 2012-04-20 DIAGNOSIS — D72829 Elevated white blood cell count, unspecified: Secondary | ICD-10-CM

## 2012-04-24 ENCOUNTER — Telehealth: Payer: Self-pay | Admitting: Oncology

## 2012-04-24 NOTE — Telephone Encounter (Signed)
PT call to r/s NP appt 10/1 to 10/09 @ 1:30 w/ Dr.Shadad.

## 2012-04-25 ENCOUNTER — Ambulatory Visit: Payer: 59

## 2012-04-25 ENCOUNTER — Other Ambulatory Visit: Payer: 59 | Admitting: Lab

## 2012-04-25 ENCOUNTER — Ambulatory Visit: Payer: 59 | Admitting: Oncology

## 2012-04-28 ENCOUNTER — Other Ambulatory Visit: Payer: Self-pay | Admitting: Oncology

## 2012-04-28 DIAGNOSIS — D72829 Elevated white blood cell count, unspecified: Secondary | ICD-10-CM

## 2012-05-03 ENCOUNTER — Other Ambulatory Visit (HOSPITAL_BASED_OUTPATIENT_CLINIC_OR_DEPARTMENT_OTHER): Payer: 59 | Admitting: Lab

## 2012-05-03 ENCOUNTER — Ambulatory Visit: Payer: 59

## 2012-05-03 ENCOUNTER — Ambulatory Visit (HOSPITAL_BASED_OUTPATIENT_CLINIC_OR_DEPARTMENT_OTHER): Payer: 59 | Admitting: Oncology

## 2012-05-03 ENCOUNTER — Telehealth: Payer: Self-pay | Admitting: Oncology

## 2012-05-03 VITALS — BP 139/94 | HR 83 | Temp 97.3°F | Resp 20 | Ht 65.5 in | Wt 161.3 lb

## 2012-05-03 DIAGNOSIS — D72829 Elevated white blood cell count, unspecified: Secondary | ICD-10-CM

## 2012-05-03 LAB — CBC WITH DIFFERENTIAL/PLATELET
BASO%: 0.6 % (ref 0.0–2.0)
LYMPH%: 21.6 % (ref 14.0–49.7)
MCHC: 33 g/dL (ref 31.5–36.0)
MCV: 88.3 fL (ref 79.5–101.0)
MONO%: 7.8 % (ref 0.0–14.0)
Platelets: 341 10*3/uL (ref 145–400)
RBC: 4.27 10*6/uL (ref 3.70–5.45)
RDW: 13.4 % (ref 11.2–14.5)
WBC: 10.9 10*3/uL — ABNORMAL HIGH (ref 3.9–10.3)

## 2012-05-03 LAB — COMPREHENSIVE METABOLIC PANEL (CC13)
ALT: 13 U/L (ref 0–55)
AST: 15 U/L (ref 5–34)
Alkaline Phosphatase: 54 U/L (ref 40–150)
Potassium: 4.1 mEq/L (ref 3.5–5.1)
Sodium: 139 mEq/L (ref 136–145)
Total Bilirubin: 0.3 mg/dL (ref 0.20–1.20)
Total Protein: 7.4 g/dL (ref 6.4–8.3)

## 2012-05-03 NOTE — Progress Notes (Signed)
REASON FOR CONSULTATION:  Leukocytosis.  HISTORY OF PRESENT ILLNESS:  Jennifer Dougherty is a pleasant 51 year old woman, native of [place], currently of [place], had been living around this area since 1985.  This is a pleasant woman without any significant past medical history.  She currently works in the [employer] and has done so for many years at this time.  She sees Dr. Reynaldo Minium for her general OB/GYN need, also for routine physical examinations.  Her last blood counts including a CBC noted that she has more leukocytosis.  Her white cell count on March 31, 2012 was 12.0.  She had a normal hemoglobin, normal platelets, normal differential.  Three weeks prior to that, she had a white cell count of 11.5.  For that reason, the patient was referred to me for evaluation. Clinically she is asymptomatic.  She had not reported any infectious process.  She had not had any recent illnesses or hospitalizations.  She does report stress related to her job and family but nothing really out of the ordinary at this time.  She had not had any seasonal allergies. Has not had any operations or hospitalizations.  She has had previous white cell counts dating back to 2005 that have fluctuated as high as 13,000 and as low as 6000.  REVIEW OF SYSTEMS:  She did not report any headaches, blurry vision, double vision.  She did not report any motor or sensory neuropathy.  She did not report any alteration in mental status, psychiatric issues, depression.  She did not report any fever, chills, sweats.  She did not report any cough, hemoptysis, hematemesis.  No nausea, vomiting, abdominal pain, hematochezia, or melena, genitourinary complaints.  Rest of review of systems unremarkable.  PAST MEDICAL HISTORY:  Really unremarkable.  She does have a history of hypertension, diabetes.  She has had sinus headaches, occasional seasonal allergies.  MEDICATIONS:  She is on calcium and  vitamin D.  ALLERGIES:  None.  SOCIAL HISTORY:  She is married.  She has 2 children.  Denied any alcohol or tobacco abuse.  Her children are 46 and 9 years old, healthy.  She is married.  Currently works as a Lawyer.  FAMILY HISTORY:  Both her mother and father are in good health with a history of hypertension.  She has a history of colon cancer on her mother's side and she gets her routine colonoscopies.  PHYSICAL EXAMINATION:  Alert, awake woman, appeared in no active distress.  Vital signs:  Blood pressure is 139/64, weight is 161, pulse was 97, respirations 20, __________ is 83.  She is afebrile.  ECOG performance status is zero.  HEENT:  Head is normocephalic, atraumatic. Pupils equal, round, reactive to light.  Oral mucosa moist and pink. Neck:  Supple without adenopathy.  Heart:  Regular rate, S1, S2.  Lungs: Clear to auscultation.  Abdomen:  Soft, nontender.  No hepatosplenomegaly.  Extremities:  No edema.  LABORATORY DATA:  Showed a hemoglobin of 12.4, white cell count of 10.9, platelet count 341.  Differential is perfectly normal at this time. Peripheral smear was personally reviewed today and did not really show any evidence of any abnormalities.  White cells including neutrophils and lymphocytes appeared normal in size and shape, no evidence of any dysplasia or immature cells.  She had normal morphology in the red cells and platelets as well.  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  Differential diagnosis discussed today with Mrs. Enke.  I explained to her that major categories include reactive leukocytosis versus primary disorder. I favor reactive leukocytosis in her situation.  I think etiology could be related to seasonal allergy, stress or just a normal variation in her white cell count.   I doubt that this is a primary blood disorder such as myeloproliferative disorder or leukemic process.  At this point her peripheral smear is perfectly healthy at this time, but for completeness sake, I will repeat her white cell count in 6 months and if it is fluctuating around the same range, no further intervention is warranted. All her questions were answered today.    ______________________________ Benjiman Core, M.D. FNS/MEDQ  D:  05/03/2012  T:  05/03/2012  Job:  [id number]

## 2012-05-03 NOTE — Progress Notes (Signed)
Note dictated

## 2012-05-03 NOTE — Telephone Encounter (Signed)
appts made and printed for pt aom °

## 2012-07-28 ENCOUNTER — Encounter: Payer: Self-pay | Admitting: Gynecology

## 2012-07-28 ENCOUNTER — Ambulatory Visit (INDEPENDENT_AMBULATORY_CARE_PROVIDER_SITE_OTHER): Payer: 59

## 2012-07-28 ENCOUNTER — Ambulatory Visit (INDEPENDENT_AMBULATORY_CARE_PROVIDER_SITE_OTHER): Payer: 59 | Admitting: Gynecology

## 2012-07-28 DIAGNOSIS — N83209 Unspecified ovarian cyst, unspecified side: Secondary | ICD-10-CM

## 2012-07-28 DIAGNOSIS — N83201 Unspecified ovarian cyst, right side: Secondary | ICD-10-CM

## 2012-07-28 DIAGNOSIS — N942 Vaginismus: Secondary | ICD-10-CM

## 2012-07-28 DIAGNOSIS — IMO0001 Reserved for inherently not codable concepts without codable children: Secondary | ICD-10-CM

## 2012-07-28 DIAGNOSIS — Z862 Personal history of diseases of the blood and blood-forming organs and certain disorders involving the immune mechanism: Secondary | ICD-10-CM

## 2012-07-28 NOTE — Progress Notes (Signed)
Patient presented to the office today for followup ultrasound. She was seen the office on 03/13/2012 for her annual exam (prior history of LAVH). Because of vaginismus an ultrasound was done that day which demonstrated the following:  Ultrasound today: Right ovary had a 27 x 21 x 20 mm cyst thick wall irregular shaped cystic lumen internal low level echoes with positive color flow the periphery probably corpus luteum cyst. Left ovary was otherwise normal no fluid in the cul-de-sac.   Disappeared been a functional cyst and she was asked to return for followup. The ultrasound today demonstrated the following:  Absent uterus. Left ovary normal. Right ovarian echo-free thick wall a vascular cyst measuring 21 x 16 x 16 mm consistent with corpus luteum cyst. Trace free fluid seen in the cul-de-sac.  Patient otherwise asymptomatic. We discussed that this was possibly a functional cyst which has decreased in size. She will return in 6 months for followup ultrasound. A CA 125 will be drawn today. We discussed the limitations of the CA 125. Since patient is 52 years of age and has history of a sharing razor blades at home we are going to screen her with a hepatitis C blood test. 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 Her last white blood count had been reported at 10.5 and she will followup with a repeat CBC with her hematologist oncologist 6 months from the last visit.

## 2012-07-28 NOTE — Patient Instructions (Addendum)
Ovarian Cyst The ovaries are small organs that are on each side of the uterus. The ovaries are the organs that produce the female hormones, estrogen and progesterone. An ovarian cyst is a sac filled with fluid that can vary in its size. It is normal for a small cyst to form in women who are in the childbearing age and who have menstrual periods. This type of cyst is called a follicle cyst that becomes an ovulation cyst (corpus luteum cyst) after it produces the women's egg. It later goes away on its own if the woman does not become pregnant. There are other kinds of ovarian cysts that may cause problems and may need to be treated. The most serious problem is a cyst with cancer. It should be noted that menopausal women who have an ovarian cyst are at a higher risk of it being a cancer cyst. They should be evaluated very quickly, thoroughly and followed closely. This is especially true in menopausal women because of the high rate of ovarian cancer in women in menopause. CAUSES AND TYPES OF OVARIAN CYSTS:  FUNCTIONAL CYST: The follicle/corpus luteum cyst is a functional cyst that occurs every month during ovulation with the menstrual cycle. They go away with the next menstrual cycle if the woman does not get pregnant. Usually, there are no symptoms with a functional cyst.  ENDOMETRIOMA CYST: This cyst develops from the lining of the uterus tissue. This cyst gets in or on the ovary. It grows every month from the bleeding during the menstrual period. It is also called a "chocolate cyst" because it becomes filled with blood that turns brown. This cyst can cause pain in the lower abdomen during intercourse and with your menstrual period.  CYSTADENOMA CYST: This cyst develops from the cells on the outside of the ovary. They usually are not cancerous. They can get very big and cause lower abdomen pain and pain with intercourse. This type of cyst can twist on itself, cut off its blood supply and cause severe pain. It  also can easily rupture and cause a lot of pain.  DERMOID CYST: This type of cyst is sometimes found in both ovaries. They are found to have different kinds of body tissue in the cyst. The tissue includes skin, teeth, hair, and/or cartilage. They usually do not have symptoms unless they get very big. Dermoid cysts are rarely cancerous.  POLYCYSTIC OVARY: This is a rare condition with hormone problems that produces many small cysts on both ovaries. The cysts are follicle-like cysts that never produce an egg and become a corpus luteum. It can cause an increase in body weight, infertility, acne, increase in body and facial hair and lack of menstrual periods or rare menstrual periods. Many women with this problem develop type 2 diabetes. The exact cause of this problem is unknown. A polycystic ovary is rarely cancerous.  THECA LUTEIN CYST: Occurs when too much hormone (human chorionic gonadotropin) is produced and over-stimulates the ovaries to produce an egg. They are frequently seen when doctors stimulate the ovaries for invitro-fertilization (test tube babies).  LUTEOMA CYST: This cyst is seen during pregnancy. Rarely it can cause an obstruction to the birth canal during labor and delivery. They usually go away after delivery. SYMPTOMS   Pelvic pain or pressure.  Pain during sexual intercourse.  Increasing girth (swelling) of the abdomen.  Abnormal menstrual periods.  Increasing pain with menstrual periods.  You stop having menstrual periods and you are not pregnant. DIAGNOSIS  The diagnosis can   be made during:  Routine or annual pelvic examination (common).  Ultrasound.  X-ray of the pelvis.  CT Scan.  MRI.  Blood tests. TREATMENT   Treatment may only be to follow the cyst monthly for 2 to 3 months with your caregiver. Many go away on their own, especially functional cysts.  May be aspirated (drained) with a long needle with ultrasound, or by laparoscopy (inserting a tube into  the pelvis through a small incision).  The whole cyst can be removed by laparoscopy.  Sometimes the cyst may need to be removed through an incision in the lower abdomen.  Hormone treatment is sometimes used to help dissolve certain cysts.  Birth control pills are sometimes used to help dissolve certain cysts. HOME CARE INSTRUCTIONS  Follow your caregiver's advice regarding:  Medicine.  Follow up visits to evaluate and treat the cyst.  You may need to come back or make an appointment with another caregiver, to find the exact cause of your cyst, if your caregiver is not a gynecologist.  Get your yearly and recommended pelvic examinations and Pap tests.  Let your caregiver know if you have had an ovarian cyst in the past. SEEK MEDICAL CARE IF:   Your periods are late, irregular, they stop, or are painful.  Your stomach (abdomen) or pelvic pain does not go away.  Your stomach becomes larger or swollen.  You have pressure on your bladder or trouble emptying your bladder completely.  You have painful sexual intercourse.  You have feelings of fullness, pressure, or discomfort in your stomach.  You lose weight for no apparent reason.  You feel generally ill.  You become constipated.  You lose your appetite.  You develop acne.  You have an increase in body and facial hair.  You are gaining weight, without changing your exercise and eating habits.  You think you are pregnant. SEEK IMMEDIATE MEDICAL CARE IF:   You have increasing abdominal pain.  You feel sick to your stomach (nausea) and/or vomit.  You develop a fever that comes on suddenly.  You develop abdominal pain during a bowel movement.  Your menstrual periods become heavier than usual. Document Released: 07/12/2005 Document Revised: 10/04/2011 Document Reviewed: 05/15/2009 [facility] Patient Information 2013 [place], [place]. Hepatitis C This is a test to determine if you have contracted the hepatitis C  virus (HCV) and to monitor treatment of the infection. Hepatitis C is a virus that can infect and damage the liver. Hepatitis C antibody is produced in the body in response to exposure to the hepatitis C virus (HCV). The most common test for HCV looks for these antibodies in your blood. Other tests detect the presence of viral RNA, the amount of viral RNA present, or determine the specific subtype of virus. Hepatitis C often leads to chronic hepatitis, which can progress to cirrhosis and liver cancer (hepatocellular carcinoma). Early detection of the virus can alert your doctor to follow your liver function more closely than usual and to consider treating you if you are chronically infected. Anti-HCV tests detect the presence of antibodies to the virus, indicating exposure to HCV. These tests cannot tell if you still have an active viral infection, only that you were exposed to the virus in the past. Usually, the test is reported as "positive" or "negative." There is some evidence that, if your test is "weakly positive," it may not mean that you have been exposed to the HCV virus. The Centers for Disease Control and Prevention (CDC) revised its guidelines  in 2003 and suggests that weakly positive tests be confirmed with the next test before being reported.  HCV RIBA test is an additional test to confirm the presence of antibodies to the virus. In most cases, it can tell if the positive anti-HCV test was due to exposure to HCV (positive RIBA) or represents a false signal (negative RIBA). In a few cases, the results cannot answer this question (indeterminate RIBA). Like the anti-HCV test, the RIBA test cannot tell if you are currently infected, only that you have been exposed to the virus.  HCV-RNA test identifies whether the virus is in your blood, indicating that you have an active infection with HCV. In the past, it was usually performed by a test called a qualitative HCV. Qualitative HCV RNA is reported as a  "positive" or "detected" if any HCV viral RNA is found; otherwise, the report will be "negative" or "not detected". The test may also be used after treatment to see if the virus has been eliminated from the body.  Viral Load or Quantitative HCV tests measure the number of viral RNA particles in your blood. Viral load tests are often used before and during treatment to help determine response to treatment by comparing the amount of virus before and after treatment (usually after 3 months); successful treatment causes a decrease of 99% or more (2 logs) in viral load soon after starting treatment (as early as 4-12 weeks), and usually leads to viral load being not detected. Some newer viral load tests can detect very low amounts of viral RNA, and some laboratories no longer do qualitative HCV RNA tests if they use one of these versions of viral load testing.  Viral genotyping is used to determine the kind, or genotype, of the virus present. There are 6 major types of HCV; the most common (genotype 1) is less likely to respond to treatment than genotypes 2 or 3 and usually requires longer therapy (48 weeks, versus 24 weeks for genotype 2 or 3). Genotyping is often ordered before treatment is started to give an idea of the likelihood of success and how long treatment may be needed.  PREPARATION FOR TEST A blood sample is obtained by inserting a needle into a vein in the arm. NORMAL FINDINGS This test is normally negative. Ranges for normal findings may vary among different laboratories and hospitals. You should always check with your doctor after having lab work or other tests done to discuss the meaning of your test results and whether your values are considered within normal limits. MEANING OF TEST  Your caregiver will go over the test results with you and discuss the importance and meaning of your results, as well as treatment options and the need for additional tests if necessary. OBTAINING THE TEST  RESULTS It is your responsibility to obtain your test results. Ask the lab or department performing the test when and how you will get your results. Document Released: 08/14/2004 Document Revised: 10/04/2011 Document Reviewed: 06/22/2008 [facility] Patient Information 2013 [place], [place].

## 2012-10-31 ENCOUNTER — Telehealth: Payer: Self-pay | Admitting: *Deleted

## 2012-10-31 NOTE — Telephone Encounter (Signed)
Pt called to cancel and r/s appt. gv 11/16/12 aptt. Pt is aware.

## 2012-11-02 ENCOUNTER — Ambulatory Visit: Payer: 59 | Admitting: Oncology

## 2012-11-02 ENCOUNTER — Other Ambulatory Visit: Payer: 59 | Admitting: Lab

## 2012-11-16 ENCOUNTER — Ambulatory Visit (HOSPITAL_BASED_OUTPATIENT_CLINIC_OR_DEPARTMENT_OTHER): Payer: 59 | Admitting: Oncology

## 2012-11-16 ENCOUNTER — Other Ambulatory Visit (HOSPITAL_BASED_OUTPATIENT_CLINIC_OR_DEPARTMENT_OTHER): Payer: 59 | Admitting: Lab

## 2012-11-16 VITALS — BP 148/81 | HR 81 | Temp 98.1°F | Resp 18 | Ht 65.5 in | Wt 159.8 lb

## 2012-11-16 DIAGNOSIS — D72829 Elevated white blood cell count, unspecified: Secondary | ICD-10-CM

## 2012-11-16 LAB — CBC WITH DIFFERENTIAL/PLATELET
BASO%: 0.3 % (ref 0.0–2.0)
EOS%: 1 % (ref 0.0–7.0)
LYMPH%: 22.1 % (ref 14.0–49.7)
MCH: 28.3 pg (ref 25.1–34.0)
MCHC: 32.9 g/dL (ref 31.5–36.0)
MONO#: 1.2 10*3/uL — ABNORMAL HIGH (ref 0.1–0.9)
MONO%: 8.3 % (ref 0.0–14.0)
NEUT%: 68.3 % (ref 38.4–76.8)
Platelets: 330 10*3/uL (ref 145–400)
RBC: 4.41 10*6/uL (ref 3.70–5.45)
WBC: 14 10*3/uL — ABNORMAL HIGH (ref 3.9–10.3)

## 2012-11-16 LAB — CHCC SMEAR

## 2012-11-16 NOTE — Progress Notes (Signed)
Hematology and Oncology Follow Up Visit  Jennifer Dougherty [id number] 1961-04-05 52 y.o. 11/16/2012 4:48 PM Ok Edwards, MDNo ref. provider found   Principle Diagnosis: 52 year old with leukocytosis, likely reactive in nature. Diagnosed in 04/2012 but has been observed since 2004.   Interim History: [name] presents today for a follow up visit. She is a very nice women with long standing history of leukocytosis dating back to 2004. Her WBC has ranged as high as 14 K to normal. She is asymptomatic at this time but report a lot of stress at this time. She had not had any seasonal allergies. Has not had any operations or hospitalizations.    Medications: I have reviewed the patient's current medications. Current outpatient prescriptions:Calcium Carbonate-Vit D-Min (CALTRATE PLUS PO), Take by mouth 2 (two) times daily.  , Disp: , Rfl:   Allergies: No Known Allergies  Past Medical History, Surgical history, Social history, and Family History were reviewed and updated.  Review of Systems: Constitutional:  Negative for fever, chills, night sweats, anorexia, weight loss, pain. Cardiovascular: no chest pain or dyspnea on exertion Respiratory: negative Neurological: negative Dermatological: negative ENT: negative Skin: Negative. Gastrointestinal: negative Genito-Urinary: negative Hematological and Lymphatic: negative Breast: negative Musculoskeletal: negative Remaining ROS negative. Physical Exam: Blood pressure 148/81, pulse 81, temperature 98.1 F (36.7 C), temperature source Oral, resp. rate 18, height 5' 5.5" (1.664 m), weight 159 lb 12.8 oz (72.485 kg). ECOG:  General appearance: alert Head: Normocephalic, without obvious abnormality, atraumatic Neck: no adenopathy, no carotid bruit, no JVD, supple, symmetrical, trachea midline and thyroid not enlarged, symmetric, no tenderness/mass/nodules Lymph nodes: Cervical, supraclavicular, and axillary nodes normal. Heart:regular  rate and rhythm, S1, S2 normal, no murmur, click, rub or gallop Lung:chest clear, no wheezing, rales, normal symmetric air entry Abdomin: soft, non-tender, without masses or organomegaly EXT:no erythema, induration, or nodules   Lab Results: Lab Results  Component Value Date   WBC 14.0* 11/16/2012   HGB 12.5 11/16/2012   HCT 38.0 11/16/2012   MCV 86.1 11/16/2012   PLT 330 11/16/2012     Chemistry      Component Value Date/Time   NA 139 05/03/2012 1353   NA 139 02/19/2011 0939   K 4.1 05/03/2012 1353   K 4.5 02/19/2011 0939   CL 103 05/03/2012 1353   CL 103 02/19/2011 0939   CO2 25 05/03/2012 1353   CO2 25 02/19/2011 0939   BUN 15.0 05/03/2012 1353   BUN 10 02/19/2011 0939   CREATININE 0.7 05/03/2012 1353   CREATININE 0.58 02/19/2011 0939      Component Value Date/Time   CALCIUM 9.8 05/03/2012 1353   CALCIUM 9.6 02/19/2011 0939   ALKPHOS 54 05/03/2012 1353   ALKPHOS 45 02/19/2011 0939   AST 15 05/03/2012 1353   AST 13 02/19/2011 0939   ALT 13 05/03/2012 1353   ALT 8 02/19/2011 0939   BILITOT 0.30 05/03/2012 1353   BILITOT 0.4 02/19/2011 0939       Impression and Plan:  57 -year-old woman with 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   I favor reactive leukocytosis in her situation. I think etiology could be related to seasonal allergy, stress or just a normal variation in her  white cell count.  I doubt that this is a primary blood disorder such as myeloproliferative disorder or leukemic process.  She is up to date from a cancer screening stand point. She had a  CA 125 that was normal.  For now, I do not see any need for routine hematological work up or follow up, but I will be happy to see her as needed.    [facility], MD 4/24/20144:48 PM

## 2013-02-08 ENCOUNTER — Ambulatory Visit: Payer: Self-pay

## 2013-02-08 ENCOUNTER — Ambulatory Visit (INDEPENDENT_AMBULATORY_CARE_PROVIDER_SITE_OTHER): Payer: 59 | Admitting: Gynecology

## 2013-02-08 ENCOUNTER — Other Ambulatory Visit: Payer: Self-pay | Admitting: Gynecology

## 2013-02-08 ENCOUNTER — Ambulatory Visit (INDEPENDENT_AMBULATORY_CARE_PROVIDER_SITE_OTHER): Payer: 59

## 2013-02-08 DIAGNOSIS — N83209 Unspecified ovarian cyst, unspecified side: Secondary | ICD-10-CM

## 2013-02-08 DIAGNOSIS — Z8742 Personal history of other diseases of the female genital tract: Secondary | ICD-10-CM

## 2013-02-08 DIAGNOSIS — Z13220 Encounter for screening for lipoid disorders: Secondary | ICD-10-CM

## 2013-02-08 DIAGNOSIS — R635 Abnormal weight gain: Secondary | ICD-10-CM

## 2013-02-08 DIAGNOSIS — Z01419 Encounter for gynecological examination (general) (routine) without abnormal findings: Secondary | ICD-10-CM

## 2013-02-08 NOTE — Progress Notes (Signed)
Patient is a 52 year old who was seen in the office on 07/28/2012 for a followup ultrasound. Patient previously to that had been seen in the office on 03/13/2012 for her annual exam. Patient with prior history of LAVH. Because of her vaginismus and ultrasound have been ordered which had demonstrated the following:  Right ovary had a 27 x 21 x 20 mm cyst thick wall irregular shaped cystic lumen internal low level echoes with positive color flow the periphery probably corpus luteum cyst. Left ovary was otherwise normal no fluid in the cul-de-sac. This appeared to have been a functional cysts back in 2013 and her followup ultrasound in January this year had demonstrated the cyst was decreasing in size and had the following dimension:  Right ovarian echo-free thick wall a vascular cyst measuring 21 x 16 x 16 mm consistent with corpus luteum cyst. Trace free fluid seen in the cul-de-sac. The patient had a normal CA 125.  Patient is asymptomatic. Ultrasound today: Vaginal cuff is normal. Ovaries appeared to be normal. Trace amount of free fluid seen in the cul-de-sac.  Assessment/plan: Simple functional cyst resolved. Patient asymptomatic. Patient scheduled for annual exam in August of this year. Patient will have her lab work and mammogram the week prior to her office visit.

## 2013-02-23 ENCOUNTER — Other Ambulatory Visit: Payer: Self-pay

## 2013-02-23 DIAGNOSIS — Z1231 Encounter for screening mammogram for malignant neoplasm of breast: Secondary | ICD-10-CM

## 2013-03-14 ENCOUNTER — Encounter: Payer: Self-pay | Admitting: Gynecology

## 2013-03-15 ENCOUNTER — Other Ambulatory Visit: Payer: 59

## 2013-03-15 DIAGNOSIS — Z01419 Encounter for gynecological examination (general) (routine) without abnormal findings: Secondary | ICD-10-CM

## 2013-03-15 DIAGNOSIS — R635 Abnormal weight gain: Secondary | ICD-10-CM

## 2013-03-15 DIAGNOSIS — Z13220 Encounter for screening for lipoid disorders: Secondary | ICD-10-CM

## 2013-03-15 LAB — CBC WITH DIFFERENTIAL/PLATELET
Basophils Absolute: 0 10*3/uL (ref 0.0–0.1)
Basophils Relative: 0 % (ref 0–1)
Eosinophils Absolute: 0.1 10*3/uL (ref 0.0–0.7)
MCH: 28.4 pg (ref 26.0–34.0)
MCHC: 32.9 g/dL (ref 30.0–36.0)
Neutro Abs: 6.2 10*3/uL (ref 1.7–7.7)
Neutrophils Relative %: 65 % (ref 43–77)
Platelets: 350 10*3/uL (ref 150–400)
RDW: 13.9 % (ref 11.5–15.5)

## 2013-03-15 LAB — LIPID PANEL
Total CHOL/HDL Ratio: 2.9 Ratio
VLDL: 17 mg/dL (ref 0–40)

## 2013-03-15 LAB — COMPREHENSIVE METABOLIC PANEL
AST: 13 U/L (ref 0–37)
BUN: 10 mg/dL (ref 6–23)
CO2: 27 mEq/L (ref 19–32)
Calcium: 9.8 mg/dL (ref 8.4–10.5)
Chloride: 101 mEq/L (ref 96–112)
Creat: 0.64 mg/dL (ref 0.50–1.10)

## 2013-03-16 ENCOUNTER — Other Ambulatory Visit: Payer: 59

## 2013-03-16 LAB — URINALYSIS W MICROSCOPIC + REFLEX CULTURE
Casts: NONE SEEN
Crystals: NONE SEEN
Leukocytes, UA: NEGATIVE
Nitrite: NEGATIVE
Specific Gravity, Urine: <1.005 — ABNORMAL LOW (ref 1.005–1.030)
Squamous Epithelial / LPF: NONE SEEN
pH: 7 (ref 5.0–8.0)

## 2013-03-21 ENCOUNTER — Ambulatory Visit
Admission: RE | Admit: 2013-03-21 | Discharge: 2013-03-21 | Disposition: A | Discharge status: Home / Self Care | Payer: 59 | Source: Ambulatory Visit

## 2013-03-21 DIAGNOSIS — Z1231 Encounter for screening mammogram for malignant neoplasm of breast: Secondary | ICD-10-CM

## 2013-03-28 ENCOUNTER — Other Ambulatory Visit: Payer: 59

## 2013-04-04 ENCOUNTER — Encounter: Payer: Self-pay | Admitting: Gynecology

## 2013-04-04 ENCOUNTER — Ambulatory Visit (INDEPENDENT_AMBULATORY_CARE_PROVIDER_SITE_OTHER): Payer: 59 | Admitting: Gynecology

## 2013-04-04 VITALS — BP 126/82 | Ht 65.5 in | Wt 161.0 lb

## 2013-04-04 DIAGNOSIS — Z01419 Encounter for gynecological examination (general) (routine) without abnormal findings: Secondary | ICD-10-CM
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
## 2013-04-04 NOTE — Patient Instructions (Addendum)
Remember to log into [facility] next book for your free menopause info book!  Hormone Therapy At menopause, your body begins making less estrogen and progesterone hormones. This causes the body to stop having menstrual periods. This is because estrogen and progesterone hormones control your periods and menstrual cycle. A lack of estrogen may cause symptoms such as: Hot flushes (or hot flashes). Vaginal dryness. Dry skin. Loss of sex drive. Risk of bone loss (osteoporosis). When this happens, you may choose to take hormone therapy to get back the estrogen lost during menopause. When the hormone estrogen is given alone, it is usually referred to as ET (Estrogen Therapy). When the hormone progestin is combined with estrogen, it is generally called HT (Hormone Therapy). This was formerly known as hormone replacement therapy (HRT). Your caregiver can help you make a decision on what will be best for you. The decision to use HT seems to change often as new studies are done. Many studies do not agree on the benefits of hormone replacement therapy. LIKELY BENEFITS OF HT INCLUDE PROTECTION FROM: Hot Flushes (also called hot flashes) - A hot flush is a sudden feeling of heat that spreads over the face and body. The skin may redden like a blush. It is connected with sweats and sleep disturbance. Women going through menopause may have hot flushes a few times a month or several times per day depending on the woman. Osteoporosis (bone loss)- Estrogen helps guard against bone loss. After menopause, a woman's bones slowly lose calcium and become weak and brittle. As a result, bones are more likely to break. The hip, wrist, and spine are affected most often. Hormone therapy can help slow bone loss after menopause. Weight bearing exercise and taking calcium with vitamin D also can help prevent bone loss. There are also medications that your caregiver can prescribe that can help prevent osteoporosis. Vaginal Dryness - Loss of  estrogen causes changes in the vagina. Its lining may become thin and dry. These changes can cause pain and bleeding during sexual intercourse. Dryness can also lead to infections. This can cause burning and itching. (Vaginal estrogen treatment can help relieve pain, itching, and dryness.) Urinary Tract Infections are more common after menopause because of lack of estrogen. Some women also develop urinary incontinence because of low estrogen levels in the vagina and bladder. Possible other benefits of estrogen include a positive effect on mood and short-term memory in women. RISKS AND COMPLICATIONS Using estrogen alone without progesterone causes the lining of the uterus to grow. This increases the risk of lining of the uterus (endometrial) cancer. Your caregiver should give another hormone called progestin if you have a uterus. Women who take combined (estrogen and progestin) HT appear to have an increased risk of breast cancer. The risk appears to be small, but increases throughout the time that HT is taken. Combined therapy also makes the breast tissue slightly denser which makes it harder to read mammograms (breast X-rays). Combined, estrogen and progesterone therapy can be taken together every day, in which case there may be spotting of blood. HT therapy can be taken cyclically in which case you will have menstrual periods. Cyclically means HT is taken for a set amount of days, then not taken, then this process is repeated. HT may increase the risk of stroke, heart attack, breast cancer and forming blood clots in your leg. Transdermal estrogen (estrogen that is absorbed through the skin with a patch or a cream) may have more positive results with: Cholesterol. Blood  pressure. Blood clots. Having the following conditions may indicate you should not have HT: Endometrial cancer. Liver disease. Breast cancer. Heart disease. History of blood clots. Stroke. TREATMENT  If you choose to take HT and  have a uterus, usually estrogen and progestin are prescribed. Your caregiver will help you decide the best way to take the medications. Possible ways to take estrogen include: Pills. Patches. Gels. Sprays. Vaginal estrogen cream, rings and tablets. It is best to take the lowest dose possible that will help your symptoms and take them for the shortest period of time that you can. Hormone therapy can help relieve some of the problems (symptoms) that affect women at menopause. Before making a decision about HT, talk to your caregiver about what is best for you. Be well informed and comfortable with your decisions. HOME CARE INSTRUCTIONS  Follow your caregivers advice when taking the medications. A Pap test is done to screen for cervical cancer. The first Pap test should be done at age 21. Between ages 22 and 88, Pap tests are repeated every 2 years. Beginning at age 52, you are advised to have a Pap test every 3 years as long as your past 3 Pap tests have been normal. Some women have medical problems that increase the chance of getting cervical cancer. Talk to your caregiver about these problems. It is especially important to talk to your caregiver if a new problem develops soon after your last Pap test. In these cases, your caregiver may recommend more frequent screening and Pap tests. The above recommendations are the same for women who have or have not gotten the vaccine for HPV (Human Papillomavirus). If you had a hysterectomy for a problem that was not a cancer or a condition that could lead to cancer, then you no longer need Pap tests. However, even if you no longer need a Pap test, a regular exam is a good idea to make sure no other problems are starting.  If you are between ages 13 and 75, and you have had normal Pap tests going back 10 years, you no longer need Pap tests. However, even if you no longer need a Pap test, a regular exam is a good idea to make sure no other problems are  starting.  If you have had past treatment for cervical cancer or a condition that could lead to cancer, you need Pap tests and screening for cancer for at least 20 years after your treatment. If Pap tests have been discontinued, risk factors (such as a new sexual partner) need to be re-assessed to determine if screening should be resumed. Some women may need screenings more often if they are at high risk for cervical cancer. Get mammograms done as per the advice of your caregiver. SEEK IMMEDIATE MEDICAL CARE IF: You develop abnormal vaginal bleeding. You have pain or swelling in your legs, shortness of breath, or chest pain. You develop dizziness or headaches. You have lumps or changes in your breasts or armpits. You have slurred speech. You develop weakness or numbness of your arms or legs. You have pain, burning, or bleeding when urinating. You develop abdominal pain. Document Released: 04/10/2003 Document Revised: 10/04/2011 Document Reviewed: 07/29/2010 [facility] Patient Information 2014 [place], [place]. Perimenopause Perimenopause is the time when your body begins to move into the menopause (no menstrual period for 12 straight months). It is a natural process. Perimenopause can begin 2 to 8 years before the menopause and usually lasts for one year after the menopause. During this  time, your ovaries may or may not produce an egg. The ovaries vary in their production of estrogen and progesterone hormones each month. This can cause irregular menstrual periods, difficulty in getting pregnant, vaginal bleeding between periods and uncomfortable symptoms. CAUSES  Irregular production of the ovarian hormones, estrogen and progesterone, and not ovulating every month.  Other causes include:  Tumor of the pituitary gland in the brain.  Medical disease that affects the ovaries.  Radiation treatment.  Chemotherapy.  Unknown causes.  Heavy smoking and excessive alcohol intake can bring on  perimenopause sooner. SYMPTOMS   Hot flashes.  Night sweats.  Irregular menstrual periods.  Decrease sex drive.  Vaginal dryness.  Headaches.  Mood swings.  Depression.  Memory problems.  Irritability.  Tiredness.  Weight gain.  Trouble getting pregnant.  The beginning of losing bone cells (osteoporosis).  The beginning of hardening of the arteries (atherosclerosis). DIAGNOSIS  Your caregiver will make a diagnosis by analyzing your age, menstrual history and your symptoms. They will do a physical exam noting any changes in your body, especially your female organs. Female hormone tests may or may not be helpful depending on the amount and when you produce the female hormones. However, other hormone tests may be helpful (ex. thyroid hormone) to rule out other problems. TREATMENT  The decision to treat during the perimenopause should be made by you and your caregiver depending on how the symptoms are affecting you and your life style. There are various treatments available such as:  Treating individual symptoms with a specific medication for that symptom (ex. tranquilizer for depression).  Herbal medications that can help specific symptoms.  Counseling.  Group therapy.  No treatment. HOME CARE INSTRUCTIONS   Before seeing your caregiver, make a list of your menstrual periods (when the occur, how heavy they are, how long between periods and how long they last), your symptoms and when they started.  Take the medication as recommended by your caregiver.  Sleep and rest.  Exercise.  Eat a diet that contains calcium (good for your bones) and soy (acts like estrogen hormone).  Do not smoke.  Avoid alcoholic beverages.  Taking vitamin E may help in certain cases.  Take calcium and vitamin D supplements to help prevent bone loss.  Group therapy is sometimes helpful.  Acupuncture may help in some cases. SEEK MEDICAL CARE IF:   You have any of the above and  want to know if it is perimenopause.  You want advice and treatment for any of your symptoms mentioned above.  You need a referral to a specialist (gynecologist, psychiatrist or psychologist). SEEK IMMEDIATE MEDICAL CARE IF:   You have vaginal bleeding.  Your period lasts longer than 8 days.  You periods are recurring sooner than 21 days.  You have bleeding after intercourse.  You have severe depression.  You have pain when you urinate.  You have severe headaches.  You develop vision problems. Document Released: 08/19/2004 Document Revised: 10/04/2011 Document Reviewed: 05/09/2008 [facility] Patient Information 2014 [place], [place].

## 2013-04-06 ENCOUNTER — Telehealth: Payer: Self-pay | Admitting: *Deleted

## 2013-04-06 MED ORDER — FLUCONAZOLE 150 MG PO TABS: 150.0000 mg | ORAL_TABLET | Freq: Every day | ORAL | Status: DC | PRN

## 2013-04-06 NOTE — Telephone Encounter (Signed)
Pt forgot to ask you for a Rx for Diflucan to have on hand incase a yeast infection would occur on OV 04/04/13? Pt said this has been done in the past, she does not have infection. Pt said you normally give her 3 tablets of 150 mg. Please advise

## 2013-04-06 NOTE — Telephone Encounter (Signed)
Please call a prescription Diflucan 150 mg 1 by mouth every other day #3

## 2013-04-12 ENCOUNTER — Other Ambulatory Visit: Payer: Self-pay | Admitting: Dermatology

## 2013-05-31 ENCOUNTER — Other Ambulatory Visit: Payer: Self-pay

## 2013-06-15 ENCOUNTER — Encounter: Payer: Self-pay | Admitting: Gynecology

## 2014-03-04 ENCOUNTER — Other Ambulatory Visit: Payer: Self-pay

## 2014-03-04 ENCOUNTER — Telehealth: Payer: Self-pay | Admitting: *Deleted

## 2014-03-04 DIAGNOSIS — Z01419 Encounter for gynecological examination (general) (routine) without abnormal findings: Secondary | ICD-10-CM

## 2014-03-04 DIAGNOSIS — Z1231 Encounter for screening mammogram for malignant neoplasm of breast: Secondary | ICD-10-CM

## 2014-03-04 NOTE — Telephone Encounter (Signed)
Pt has annual scheduled on 04/10/14 would like to have labs done prior to appointment. Please advise

## 2014-03-04 NOTE — Telephone Encounter (Signed)
Orders placed, pt informed. °

## 2014-03-04 NOTE — Telephone Encounter (Signed)
The patient should have the following labs drawn in a fasting state: Fasting lipid profile, CBC, comprehensive metabolic panel, TSH and urinalysis

## 2014-03-26 ENCOUNTER — Other Ambulatory Visit: Payer: 59

## 2014-03-26 DIAGNOSIS — Z01419 Encounter for gynecological examination (general) (routine) without abnormal findings: Secondary | ICD-10-CM

## 2014-03-26 LAB — CBC WITH DIFFERENTIAL/PLATELET
Basophils Absolute: 0 10*3/uL (ref 0.0–0.1)
Basophils Relative: 0 % (ref 0–1)
Eosinophils Absolute: 0.2 10*3/uL (ref 0.0–0.7)
Eosinophils Relative: 2 % (ref 0–5)
HCT: 40 % (ref 36.0–46.0)
HEMOGLOBIN: 13.4 g/dL (ref 12.0–15.0)
LYMPHS ABS: 2.2 10*3/uL (ref 0.7–4.0)
Lymphocytes Relative: 23 % (ref 12–46)
MCH: 28.7 pg (ref 26.0–34.0)
MCHC: 33.5 g/dL (ref 30.0–36.0)
MCV: 85.7 fL (ref 78.0–100.0)
MONOS PCT: 9 % (ref 3–12)
Monocytes Absolute: 0.9 10*3/uL (ref 0.1–1.0)
NEUTROS ABS: 6.3 10*3/uL (ref 1.7–7.7)
NEUTROS PCT: 66 % (ref 43–77)
Platelets: 322 10*3/uL (ref 150–400)
RBC: 4.67 MIL/uL (ref 3.87–5.11)
RDW: 14 % (ref 11.5–15.5)
WBC: 9.5 10*3/uL (ref 4.0–10.5)

## 2014-03-26 LAB — LIPID PANEL
CHOL/HDL RATIO: 2.7 ratio
Cholesterol: 194 mg/dL (ref 0–200)
HDL: 71 mg/dL (ref >39)
LDL CALC: 104 mg/dL — AB (ref 0–99)
Triglycerides: 94 mg/dL (ref <150)
VLDL: 19 mg/dL (ref 0–40)

## 2014-03-26 LAB — COMPREHENSIVE METABOLIC PANEL
ALT: 10 U/L (ref 0–35)
AST: 13 U/L (ref 0–37)
Albumin: 4.3 g/dL (ref 3.5–5.2)
Alkaline Phosphatase: 43 U/L (ref 39–117)
BILIRUBIN TOTAL: 0.6 mg/dL (ref 0.2–1.2)
BUN: 13 mg/dL (ref 6–23)
CO2: 26 meq/L (ref 19–32)
Calcium: 9 mg/dL (ref 8.4–10.5)
Chloride: 101 mEq/L (ref 96–112)
Creat: 0.65 mg/dL (ref 0.50–1.10)
Glucose, Bld: 83 mg/dL (ref 70–99)
POTASSIUM: 4.1 meq/L (ref 3.5–5.3)
SODIUM: 135 meq/L (ref 135–145)
TOTAL PROTEIN: 7 g/dL (ref 6.0–8.3)

## 2014-03-26 LAB — TSH: TSH: 1.638 u[IU]/mL (ref 0.350–4.500)

## 2014-03-27 ENCOUNTER — Ambulatory Visit
Admission: RE | Admit: 2014-03-27 | Discharge: 2014-03-27 | Disposition: A | Discharge status: Home / Self Care | Payer: 59 | Source: Ambulatory Visit

## 2014-03-27 DIAGNOSIS — Z1231 Encounter for screening mammogram for malignant neoplasm of breast: Secondary | ICD-10-CM

## 2014-03-27 LAB — URINALYSIS W MICROSCOPIC + REFLEX CULTURE
BACTERIA UA: NONE SEEN
Bilirubin Urine: NEGATIVE
CASTS: NONE SEEN
Crystals: NONE SEEN
GLUCOSE, UA: NEGATIVE mg/dL
Hgb urine dipstick: NEGATIVE
KETONES UR: NEGATIVE mg/dL
Leukocytes, UA: NEGATIVE
NITRITE: NEGATIVE
Protein, ur: NEGATIVE mg/dL
Specific Gravity, Urine: <1.005 — ABNORMAL LOW (ref 1.005–1.030)
Squamous Epithelial / LPF: NONE SEEN
Urobilinogen, UA: 0.2 mg/dL (ref 0.0–1.0)
pH: 7 (ref 5.0–8.0)

## 2014-04-02 ENCOUNTER — Other Ambulatory Visit: Payer: 59

## 2014-04-10 ENCOUNTER — Encounter: Payer: Self-pay | Admitting: Gynecology

## 2014-04-10 ENCOUNTER — Ambulatory Visit (INDEPENDENT_AMBULATORY_CARE_PROVIDER_SITE_OTHER): Payer: 59

## 2014-04-10 ENCOUNTER — Other Ambulatory Visit: Payer: Self-pay | Admitting: Gynecology

## 2014-04-10 ENCOUNTER — Ambulatory Visit (INDEPENDENT_AMBULATORY_CARE_PROVIDER_SITE_OTHER): Payer: 59 | Admitting: Gynecology

## 2014-04-10 VITALS — BP 118/72 | Ht 65.0 in | Wt 157.6 lb

## 2014-04-10 DIAGNOSIS — R143 Flatulence: Secondary | ICD-10-CM

## 2014-04-10 DIAGNOSIS — R141 Gas pain: Secondary | ICD-10-CM

## 2014-04-10 DIAGNOSIS — M5489 Other dorsalgia: Secondary | ICD-10-CM

## 2014-04-10 DIAGNOSIS — N951 Menopausal and female climacteric states: Secondary | ICD-10-CM

## 2014-04-10 DIAGNOSIS — R140 Abdominal distension (gaseous): Secondary | ICD-10-CM

## 2014-04-10 DIAGNOSIS — M549 Dorsalgia, unspecified: Secondary | ICD-10-CM

## 2014-04-10 DIAGNOSIS — N839 Noninflammatory disorder of ovary, fallopian tube and broad ligament, unspecified: Secondary | ICD-10-CM

## 2014-04-10 DIAGNOSIS — Z23 Encounter for immunization: Secondary | ICD-10-CM

## 2014-04-10 DIAGNOSIS — Z01419 Encounter for gynecological examination (general) (routine) without abnormal findings: Secondary | ICD-10-CM

## 2014-04-10 DIAGNOSIS — N83201 Unspecified ovarian cyst, right side: Secondary | ICD-10-CM | POA: Insufficient documentation

## 2014-04-10 DIAGNOSIS — R142 Eructation: Secondary | ICD-10-CM

## 2014-04-10 DIAGNOSIS — N83209 Unspecified ovarian cyst, unspecified side: Secondary | ICD-10-CM

## 2014-04-10 NOTE — Progress Notes (Signed)
Jennifer Dougherty [birth year] [id number]   History:    53 y.o.  for annual gyn exam with several complaints today. Patient states she's had some low back discomfort and abdominal bloating sensation. Also she has complained of times and feeling her neck feeling swollen. No true difficulty swallowing. Review of her records indicated that she had a laparoscopic assisted vaginal hysterectomy several years ago. Patient's recent labs consisting of CBC, comprehensive metabolic panel, TSH, fasting lipid profile and urinalysis were all normal. Her LDL was slightly elevated at 104. Patient with no previous history of abnormal Pap smears. She received a Tdap vaccine last year and is interested in the flu vaccine this year.  Patient has had periodic colonoscopy in the last one being in 2014 was reported to be normal. Grandmother with colon cancer history mother with colon polyps.  Past medical history,surgical history, family history and social history were all reviewed and documented in the EPIC chart.  Gynecologic History No LMP recorded. Patient has had a hysterectomy. Contraception: status post hysterectomy Last Pap: 2012. Results were: normal Last mammogram: 2014. Results were: normal  Obstetric History OB History  Gravida Para Term Preterm AB SAB TAB Ectopic Multiple Living  2 2        2     # Outcome Date GA Lbr Len/2nd Weight Sex Delivery Anes PTL Lv  2 PAR           1 PAR                ROS: A ROS was performed and pertinent positives and negatives are included in the history.  GENERAL: No fevers or chills. HEENT: No change in vision, no earache, sore throat or sinus congestion. NECK: Complaining of neck fullness . CARDIOVASCULAR: No chest pain or pressure. No palpitations. PULMONARY: No shortness of breath, cough or wheeze. GASTROINTESTINAL: No abdominal pain, nausea, vomiting or diarrhea, melena or bright red blood per rectum. GENITOURINARY: No urinary frequency, urgency, hesitancy or  dysuria. MUSCULOSKELETAL: No joint or muscle pain, no back pain, no recent trauma. DERMATOLOGIC: No rash, no itching, no lesions. ENDOCRINE: No polyuria, polydipsia, no heat or cold intolerance. No recent change in weight. HEMATOLOGICAL: No anemia or easy bruising or bleeding. NEUROLOGIC: No headache, seizures, numbness, tingling or weakness. PSYCHIATRIC: No depression, no loss of interest in normal activity or change in sleep pattern.     Exam: chaperone present  BP 118/72  Ht 5\' 5"  (1.651 m)  Wt 157 lb 9.6 oz (71.487 kg)  BMI 26.23 kg/m2  Body mass index is 26.23 kg/(m^2).  General appearance : Well developed well nourished female. No acute distress HEENT: Neck supple, trachea midline, no carotid bruits, submandibular lymphadenopathy? Lungs: Clear to auscultation, no rhonchi or wheezes, or rib retractions  Heart: Regular rate and rhythm, no murmurs or gallops Breast:Examined in sitting and supine position were symmetrical in appearance, no palpable masses or tenderness,  no skin retraction, no nipple inversion, no nipple discharge, no skin discoloration, no axillary or supraclavicular lymphadenopathy Abdomen: no palpable masses or tenderness, no rebound or guarding Extremities: no edema or skin discoloration or tenderness  Pelvic:  Bartholin, Urethra, Skene Glands: Within normal limits             Vagina: No gross lesions or discharge  Cervix: Absent  Uterus  absent  Adnexa  Without masses or tenderness  Anus and perineum  normal   Rectovaginal  normal sphincter tone without palpated masses or tenderness  Hemoccult will provide cards   Ultrasound today: Absent uterus. Right ovary with thick wall cyst measuring 24 x 20 x 24 mm with internal level echoes and avascular. Left ovary normal. No fluid in the cul-de-sac. Vaginal cuff was intact. Excessive bowel shadowing was noted.    Assessment/Plan:  53 y.o. female for annual exam received the flu vaccine today. Requisition  to obtain her she will vaccine in her local pharmacy was provided. She is perimenopausal and we will be checking her [place] levels today. She was provided with fecal Hemoccult cards assessment to the office for testing. Patient will followup with [facility]. Pap smear was not done today in accordance to the new guidelines. Patient will return back in 3 months for followup ultrasound. This cyst appears to be a small hemorrhagic cyst from time of ovulation. Patient is perimenopausal. We discussed CA 125 exposing cons and she would like to have the blood tests drawn today.  Note: This dictation was prepared with  Dragon/digital dictation along withSmart phrase technology. Any transcriptional errors that result from this process are unintentional.   Terrance Mass MD, 12:19 PM 04/10/2014

## 2014-04-10 NOTE — Patient Instructions (Addendum)
Influenza Virus Vaccine injection (Fluarix) What is this medicine? INFLUENZA VIRUS VACCINE (in floo EN zuh VAHY ruhs vak SEEN) helps to reduce the risk of getting influenza also known as the flu. This medicine may be used for other purposes; ask your health care provider or pharmacist if you have questions. COMMON BRAND NAME(S): Fluarix, Fluzone What should I tell my health care provider before I take this medicine? They need to know if you have any of these conditions: -bleeding disorder like hemophilia -fever or infection -Guillain-Barre syndrome or other neurological problems -immune system problems -infection with the human immunodeficiency virus (HIV) or AIDS -low blood platelet counts -multiple sclerosis -an unusual or allergic reaction to influenza virus vaccine, eggs, chicken proteins, latex, gentamicin, other medicines, foods, dyes or preservatives -pregnant or trying to get pregnant -breast-feeding How should I use this medicine? This vaccine is for injection into a muscle. It is given by a health care professional. A copy of Vaccine Information Statements will be given before each vaccination. Read this sheet carefully each time. The sheet may change frequently. Talk to your pediatrician regarding the use of this medicine in children. Special care may be needed. Overdosage: If you think you have taken too much of this medicine contact a poison control center or emergency room at once. NOTE: This medicine is only for you. Do not share this medicine with others. What if I miss a dose? This does not apply. What may interact with this medicine? -chemotherapy or radiation therapy -medicines that lower your immune system like etanercept, anakinra, infliximab, and adalimumab -medicines that treat or prevent blood clots like warfarin -phenytoin -steroid medicines like prednisone or cortisone -theophylline -vaccines This list may not describe all possible interactions. Give your  health care provider a list of all the medicines, herbs, non-prescription drugs, or dietary supplements you use. Also tell them if you smoke, drink alcohol, or use illegal drugs. Some items may interact with your medicine. What should I watch for while using this medicine? Report any side effects that do not go away within 3 days to your doctor or health care professional. Call your health care provider if any unusual symptoms occur within 6 weeks of receiving this vaccine. You may still catch the flu, but the illness is not usually as bad. You cannot get the flu from the vaccine. The vaccine will not protect against colds or other illnesses that may cause fever. The vaccine is needed every year. What side effects may I notice from receiving this medicine? Side effects that you should report to your doctor or health care professional as soon as possible: -allergic reactions like skin rash, itching or hives, swelling of the face, lips, or tongue Side effects that usually do not require medical attention (report to your doctor or health care professional if they continue or are bothersome): -fever -headache -muscle aches and pains -pain, tenderness, redness, or swelling at site where injected -weak or tired This list may not describe all possible side effects. Call your doctor for medical advice about side effects. You may report side effects to FDA at 1-800-FDA-1088. Where should I keep my medicine? This vaccine is only given in a clinic, pharmacy, doctor's office, or other health care setting and will not be stored at home. NOTE: This sheet is a summary. It may not cover all possible information. If you have questions about this medicine, talk to your doctor, pharmacist, or health care provider.  2015, [employer]vier/[employer] Standard. (2008-02-07 09:30:40) Hormone Therapy At menopause, your  body begins making less estrogen and progesterone hormones. This causes the body to stop having menstrual periods.  This is because estrogen and progesterone hormones control your periods and menstrual cycle. A lack of estrogen may cause symptoms such as:  Hot flushes (or hot flashes).  Vaginal dryness.  Dry skin.  Loss of sex drive.  Risk of bone loss (osteoporosis). When this happens, you may choose to take hormone therapy to get back the estrogen lost during menopause. When the hormone estrogen is given alone, it is usually referred to as ET (Estrogen Therapy). When the hormone progestin is combined with estrogen, it is generally called HT (Hormone Therapy). This was formerly known as hormone replacement therapy (HRT). Your caregiver can help you make a decision on what will be best for you. The decision to use HT seems to change often as new studies are done. Many studies do not agree on the benefits of hormone replacement therapy. LIKELY BENEFITS OF HT INCLUDE PROTECTION FROM:  Hot Flushes (also called hot flashes) - A hot flush is a sudden feeling of heat that spreads over the face and body. The skin may redden like a blush. It is connected with sweats and sleep disturbance. Women going through menopause may have hot flushes a few times a month or several times per day depending on the woman.  Osteoporosis (bone loss)- Estrogen helps guard against bone loss. After menopause, a woman's bones slowly lose calcium and become weak and brittle. As a result, bones are more likely to break. The hip, wrist, and spine are affected most often. Hormone therapy can help slow bone loss after menopause. Weight bearing exercise and taking calcium with vitamin D also can help prevent bone loss. There are also medications that your caregiver can prescribe that can help prevent osteoporosis.  Vaginal Dryness - Loss of estrogen causes changes in the vagina. Its lining may become thin and dry. These changes can cause pain and bleeding during sexual intercourse. Dryness can also lead to infections. This can cause burning and  itching. (Vaginal estrogen treatment can help relieve pain, itching, and dryness.)  Urinary Tract Infections are more common after menopause because of lack of estrogen. Some women also develop urinary incontinence because of low estrogen levels in the vagina and bladder.  Possible other benefits of estrogen include a positive effect on mood and short-term memory in women. RISKS AND COMPLICATIONS  Using estrogen alone without progesterone causes the lining of the uterus to grow. This increases the risk of lining of the uterus (endometrial) cancer. Your caregiver should give another hormone called progestin if you have a uterus.  Women who take combined (estrogen and progestin) HT appear to have an increased risk of breast cancer. The risk appears to be small, but increases throughout the time that HT is taken.  Combined therapy also makes the breast tissue slightly denser which makes it harder to read mammograms (breast X-rays).  Combined, estrogen and progesterone therapy can be taken together every day, in which case there may be spotting of blood. HT therapy can be taken cyclically in which case you will have menstrual periods. Cyclically means HT is taken for a set amount of days, then not taken, then this process is repeated.  HT may increase the risk of stroke, heart attack, breast cancer and forming blood clots in your leg.  Transdermal estrogen (estrogen that is absorbed through the skin with a patch or a cream) may have more positive results with:  Cholesterol.  Blood  pressure.  Blood clots. Having the following conditions may indicate you should not have HT:  Endometrial cancer.  Liver disease.  Breast cancer.  Heart disease.  History of blood clots.  Stroke. TREATMENT   If you choose to take HT and have a uterus, usually estrogen and progestin are prescribed.  Your caregiver will help you decide the best way to take the medications.  Possible ways to take  estrogen include:  Pills.  Patches.  Gels.  Sprays.  Vaginal estrogen cream, rings and tablets.  It is best to take the lowest dose possible that will help your symptoms and take them for the shortest period of time that you can.  Hormone therapy can help relieve some of the problems (symptoms) that affect women at menopause. Before making a decision about HT, talk to your caregiver about what is best for you. Be well informed and comfortable with your decisions. HOME CARE INSTRUCTIONS   Follow your caregivers advice when taking the medications.  A Pap test is done to screen for cervical cancer.  The first Pap test should be done at age 96.  Between ages 41 and 18, Pap tests are repeated every 2 years.  Beginning at age 35, you are advised to have a Pap test every 3 years as long as your past 3 Pap tests have been normal.  Some women have medical problems that increase the chance of getting cervical cancer. Talk to your caregiver about these problems. It is especially important to talk to your caregiver if a new problem develops soon after your last Pap test. In these cases, your caregiver may recommend more frequent screening and Pap tests.  The above recommendations are the same for women who have or have not gotten the vaccine for HPV (Human Papillomavirus).  If you had a hysterectomy for a problem that was not a cancer or a condition that could lead to cancer, then you no longer need Pap tests. However, even if you no longer need a Pap test, a regular exam is a good idea to make sure no other problems are starting.   If you are between ages 3 and 11, and you have had normal Pap tests going back 10 years, you no longer need Pap tests. However, even if you no longer need a Pap test, a regular exam is a good idea to make sure no other problems are starting.   If you have had past treatment for cervical cancer or a condition that could lead to cancer, you need Pap tests and  screening for cancer for at least 20 years after your treatment.  If Pap tests have been discontinued, risk factors (such as a new sexual partner) need to be re-assessed to determine if screening should be resumed.  Some women may need screenings more often if they are at high risk for cervical cancer.  Get mammograms done as per the advice of your caregiver. SEEK IMMEDIATE MEDICAL CARE IF:  You develop abnormal vaginal bleeding.  You have pain or swelling in your legs, shortness of breath, or chest pain.  You develop dizziness or headaches.  You have lumps or changes in your breasts or armpits.  You have slurred speech.  You develop weakness or numbness of your arms or legs.  You have pain, burning, or bleeding when urinating.  You develop abdominal pain. Document Released: 04/10/2003 Document Revised: 10/04/2011 Document Reviewed: 07/29/2010 [facility] Patient Information 2015 [place], [place]. This information is not intended to replace advice given  to you by your health care provider. Make sure you discuss any questions you have with your health care provider. Perimenopause Perimenopause is the time when your body begins to move into the menopause (no menstrual period for 12 straight months). It is a natural process. Perimenopause can begin 2-8 years before the menopause and usually lasts for 1 year after the menopause. During this time, your ovaries may or may not produce an egg. The ovaries vary in their production of estrogen and progesterone hormones each month. This can cause irregular menstrual periods, difficulty getting pregnant, vaginal bleeding between periods, and uncomfortable symptoms. CAUSES  Irregular production of the ovarian hormones, estrogen and progesterone, and not ovulating every month.  Other causes include:  Tumor of the pituitary gland in the brain.  Medical disease that affects the ovaries.  Radiation treatment.  Chemotherapy.  Unknown  causes.  Heavy smoking and excessive alcohol intake can bring on perimenopause sooner. SIGNS AND SYMPTOMS   Hot flashes.  Night sweats.  Irregular menstrual periods.  Decreased sex drive.  Vaginal dryness.  Headaches.  Mood swings.  Depression.  Memory problems.  Irritability.  Tiredness.  Weight gain.  Trouble getting pregnant.  The beginning of losing bone cells (osteoporosis).  The beginning of hardening of the arteries (atherosclerosis). DIAGNOSIS  Your health care provider will make a diagnosis by analyzing your age, menstrual history, and symptoms. He or she will do a physical exam and note any changes in your body, especially your female organs. Female hormone tests may or may not be helpful depending on the amount of female hormones you produce and when you produce them. However, other hormone tests may be helpful to rule out other problems. TREATMENT  In some cases, no treatment is needed. The decision on whether treatment is necessary during the perimenopause should be made by you and your health care provider based on how the symptoms are affecting you and your lifestyle. Various treatments are available, such as:  Treating individual symptoms with a specific medicine for that symptom.  Herbal medicines that can help specific symptoms.  Counseling.  Group therapy. HOME CARE INSTRUCTIONS   Keep track of your menstrual periods (when they occur, how heavy they are, how long between periods, and how long they last) as well as your symptoms and when they started.  Only take over-the-counter or prescription medicines as directed by your health care provider.  Sleep and rest.  Exercise.  Eat a diet that contains calcium (good for your bones) and soy (acts like the estrogen hormone).  Do not smoke.  Avoid alcoholic beverages.  Take vitamin supplements as recommended by your health care provider. Taking vitamin E may help in certain cases.  Take  calcium and vitamin D supplements to help prevent bone loss.  Group therapy is sometimes helpful.  Acupuncture may help in some cases. SEEK MEDICAL CARE IF:   You have questions about any symptoms you are having.  You need a referral to a specialist (gynecologist, psychiatrist, or psychologist). SEEK IMMEDIATE MEDICAL CARE IF:   You have vaginal bleeding.  Your period lasts longer than 8 days.  Your periods are recurring sooner than 21 days.  You have bleeding after intercourse.  You have severe depression.  You have pain when you urinate.  You have severe headaches.  You have vision problems. Document Released: 08/19/2004 Document Revised: 05/02/2013 Document Reviewed: 02/08/2013 [facility] Patient Information 2015 [place], [place]. This information is not intended to replace advice given to you by your  health care provider. Make sure you discuss any questions you have with your health care provider. Ovarian Cyst An ovarian cyst is a fluid-filled sac that forms on an ovary. The ovaries are small organs that produce eggs in women. Various types of cysts can form on the ovaries. Most are not cancerous. Many do not cause problems, and they often go away on their own. Some may cause symptoms and require treatment. Common types of ovarian cysts include:  Functional cysts--These cysts may occur every month during the menstrual cycle. This is normal. The cysts usually go away with the next menstrual cycle if the woman does not get pregnant. Usually, there are no symptoms with a functional cyst.  Endometrioma cysts--These cysts form from the tissue that lines the uterus. They are also called "chocolate cysts" because they become filled with blood that turns brown. This type of cyst can cause pain in the lower abdomen during intercourse and with your menstrual period.  Cystadenoma cysts--This type develops from the cells on the outside of the ovary. These cysts can get very big and cause  lower abdomen pain and pain with intercourse. This type of cyst can twist on itself, cut off its blood supply, and cause severe pain. It can also easily rupture and cause a lot of pain.  Dermoid cysts--This type of cyst is sometimes found in both ovaries. These cysts may contain different kinds of body tissue, such as skin, teeth, hair, or cartilage. They usually do not cause symptoms unless they get very big.  Theca lutein cysts--These cysts occur when too much of a certain hormone (human chorionic gonadotropin) is produced and overstimulates the ovaries to produce an egg. This is most common after procedures used to assist with the conception of a baby (in vitro fertilization). CAUSES   Fertility drugs can cause a condition in which multiple large cysts are formed on the ovaries. This is called ovarian hyperstimulation syndrome.  A condition called polycystic ovary syndrome can cause hormonal imbalances that can lead to nonfunctional ovarian cysts. SIGNS AND SYMPTOMS  Many ovarian cysts do not cause symptoms. If symptoms are present, they may include:  Pelvic pain or pressure.  Pain in the lower abdomen.  Pain during sexual intercourse.  Increasing girth (swelling) of the abdomen.  Abnormal menstrual periods.  Increasing pain with menstrual periods.  Stopping having menstrual periods without being pregnant. DIAGNOSIS  These cysts are commonly found during a routine or annual pelvic exam. Tests may be ordered to find out more about the cyst. These tests may include:  Ultrasound.  X-ray of the pelvis.  CT scan.  MRI.  Blood tests. TREATMENT  Many ovarian cysts go away on their own without treatment. Your health care provider may want to check your cyst regularly for 2-3 months to see if it changes. For women in menopause, it is particularly important to monitor a cyst closely because of the higher rate of ovarian cancer in menopausal women. When treatment is needed, it may  include any of the following:  A procedure to drain the cyst (aspiration). This may be done using a long needle and ultrasound. It can also be done through a laparoscopic procedure. This involves using a thin, lighted tube with a tiny camera on the end (laparoscope) inserted through a small incision.  Surgery to remove the whole cyst. This may be done using laparoscopic surgery or an open surgery involving a larger incision in the lower abdomen.  Hormone treatment or birth control pills. These  methods are sometimes used to help dissolve a cyst. HOME CARE INSTRUCTIONS   Only take over-the-counter or prescription medicines as directed by your health care provider.  Follow up with your health care provider as directed.  Get regular pelvic exams and Pap tests. SEEK MEDICAL CARE IF:   Your periods are late, irregular, or painful, or they stop.  Your pelvic pain or abdominal pain does not go away.  Your abdomen becomes larger or swollen.  You have pressure on your bladder or trouble emptying your bladder completely.  You have pain during sexual intercourse.  You have feelings of fullness, pressure, or discomfort in your stomach.  You lose weight for no apparent reason.  You feel generally ill.  You become constipated.  You lose your appetite.  You develop acne.  You have an increase in body and facial hair.  You are gaining weight, without changing your exercise and eating habits.  You think you are pregnant. SEEK IMMEDIATE MEDICAL CARE IF:   You have increasing abdominal pain.  You feel sick to your stomach (nauseous), and you throw up (vomit).  You develop a fever that comes on suddenly.  You have abdominal pain during a bowel movement.  Your menstrual periods become heavier than usual. MAKE SURE YOU:  Understand these instructions.  Will watch your condition.  Will get help right away if you are not doing well or get worse. Document Released: 07/12/2005  Document Revised: 07/17/2013 Document Reviewed: 03/19/2013 [facility] Patient Information 2015 [place], [place]. This information is not intended to replace advice given to you by your health care provider. Make sure you discuss any questions you have with your health care provider.

## 2014-04-11 LAB — CA 125: CA 125: 4 U/mL (ref <35)

## 2014-04-11 LAB — FOLLICLE STIMULATING HORMONE: FSH: 14.7 m[IU]/mL

## 2014-05-27 ENCOUNTER — Encounter: Payer: Self-pay | Admitting: Gynecology

## 2014-07-10 ENCOUNTER — Other Ambulatory Visit: Payer: 59

## 2014-07-10 ENCOUNTER — Ambulatory Visit: Payer: 59 | Admitting: Gynecology

## 2014-07-31 ENCOUNTER — Other Ambulatory Visit: Payer: Self-pay | Admitting: Gynecology

## 2014-07-31 ENCOUNTER — Ambulatory Visit (INDEPENDENT_AMBULATORY_CARE_PROVIDER_SITE_OTHER): Payer: 59 | Admitting: Gynecology

## 2014-07-31 ENCOUNTER — Encounter: Payer: Self-pay | Admitting: Gynecology

## 2014-07-31 ENCOUNTER — Ambulatory Visit (INDEPENDENT_AMBULATORY_CARE_PROVIDER_SITE_OTHER): Payer: 59

## 2014-07-31 DIAGNOSIS — R599 Enlarged lymph nodes, unspecified: Secondary | ICD-10-CM

## 2014-07-31 DIAGNOSIS — N83202 Unspecified ovarian cyst, left side: Secondary | ICD-10-CM

## 2014-07-31 DIAGNOSIS — N8320 Unspecified ovarian cysts: Secondary | ICD-10-CM

## 2014-07-31 DIAGNOSIS — R188 Other ascites: Secondary | ICD-10-CM

## 2014-07-31 DIAGNOSIS — N83201 Unspecified ovarian cyst, right side: Secondary | ICD-10-CM

## 2014-07-31 DIAGNOSIS — R591 Generalized enlarged lymph nodes: Secondary | ICD-10-CM

## 2014-07-31 NOTE — Progress Notes (Signed)
   Patient presented to the office today for her 4 month follow-up ultrasound. Patient was seen in the office September 2015 complaining of some low back discomfort and abdominal bloating sensation. She also complained at times a few her next feeling swollen but no difficulty in swallowing. She was found to have bilateral submandibular lymphadenopathy and was referred to the ENT but did not follow through as of yet.  Ultrasound done 04/10/2014 demonstrated the following: Ultrasound today: Absent uterus. Right ovary with thick wall cyst measuring 24 x 20 x 24 mm with internal level echoes and avascular. Left ovary normal. No fluid in the cul-de-sac. Vaginal cuff was intact. Excessive bowel shadowing was noted.  Patient had a normal C1 25 with a value of 4.  Ultrasound today: Previously seen hemorrhagic cysts resolve. There was some fluid noted in the cul-de-sac. A thin wall left ovarian cyst with layering and debris measuring 2.3 x 1.0 x 2.0 cm with color flow Doppler present the periphery appears to be a hemorrhagic cyst on the contralateral ovary.  Assessment/plan: Patient with ovulatory hemorrhagic cysts alternating sides. Normal CA 125. Patient scheduled to return in September for annual exam will follow-up with an ultrasound. Patient continues with submandibular lymphadenopathy and it was stressed upon her to follow-up with the ENT and she will make an appointment the next week.

## 2014-12-24 ENCOUNTER — Other Ambulatory Visit: Payer: Self-pay | Admitting: Gynecology

## 2014-12-24 NOTE — Telephone Encounter (Signed)
See 04/06/13 telephone encounter.

## 2015-02-27 ENCOUNTER — Telehealth: Payer: Self-pay | Admitting: *Deleted

## 2015-02-27 DIAGNOSIS — Z01419 Encounter for gynecological examination (general) (routine) without abnormal findings: Secondary | ICD-10-CM

## 2015-02-27 NOTE — Telephone Encounter (Signed)
Pt has annual scheduled on 04/23/15 wants labs done prior to annual. Please advise

## 2015-02-27 NOTE — Telephone Encounter (Signed)
Fasting CBC/LP,TSH,U/A/CMET annual screen

## 2015-02-27 NOTE — Telephone Encounter (Signed)
Pt informed with the below note, orders placed.

## 2015-03-20 ENCOUNTER — Other Ambulatory Visit: Payer: 59

## 2015-03-20 DIAGNOSIS — Z01419 Encounter for gynecological examination (general) (routine) without abnormal findings: Secondary | ICD-10-CM

## 2015-03-20 LAB — COMPREHENSIVE METABOLIC PANEL
ALBUMIN: 4.3 g/dL (ref 3.6–5.1)
ALK PHOS: 46 U/L (ref 33–130)
ALT: 11 U/L (ref 6–29)
AST: 14 U/L (ref 10–35)
BILIRUBIN TOTAL: 0.5 mg/dL (ref 0.2–1.2)
BUN: 10 mg/dL (ref 7–25)
CALCIUM: 9.2 mg/dL (ref 8.6–10.4)
CO2: 25 mmol/L (ref 20–31)
Chloride: 102 mmol/L (ref 98–110)
Creat: 0.71 mg/dL (ref 0.50–1.05)
Glucose, Bld: 85 mg/dL (ref 65–99)
Potassium: 4.6 mmol/L (ref 3.5–5.3)
Sodium: 138 mmol/L (ref 135–146)
Total Protein: 7 g/dL (ref 6.1–8.1)

## 2015-03-20 LAB — LIPID PANEL
Cholesterol: 210 mg/dL — ABNORMAL HIGH (ref 125–200)
HDL: 80 mg/dL (ref >=46)
LDL CALC: 113 mg/dL (ref <130)
Total CHOL/HDL Ratio: 2.6 Ratio (ref <=5.0)
Triglycerides: 84 mg/dL (ref <150)
VLDL: 17 mg/dL (ref <30)

## 2015-03-20 LAB — CBC WITH DIFFERENTIAL/PLATELET
BASOS PCT: 0 % (ref 0–1)
Basophils Absolute: 0 10*3/uL (ref 0.0–0.1)
Eosinophils Absolute: 0.1 10*3/uL (ref 0.0–0.7)
Eosinophils Relative: 1 % (ref 0–5)
HCT: 40 % (ref 36.0–46.0)
Hemoglobin: 13.3 g/dL (ref 12.0–15.0)
Lymphocytes Relative: 26 % (ref 12–46)
Lymphs Abs: 2.5 10*3/uL (ref 0.7–4.0)
MCH: 29 pg (ref 26.0–34.0)
MCHC: 33.3 g/dL (ref 30.0–36.0)
MCV: 87.1 fL (ref 78.0–100.0)
MONO ABS: 0.9 10*3/uL (ref 0.1–1.0)
MPV: 11.1 fL (ref 8.6–12.4)
Monocytes Relative: 9 % (ref 3–12)
NEUTROS ABS: 6.1 10*3/uL (ref 1.7–7.7)
NEUTROS PCT: 64 % (ref 43–77)
Platelets: 362 10*3/uL (ref 150–400)
RBC: 4.59 MIL/uL (ref 3.87–5.11)
RDW: 13.7 % (ref 11.5–15.5)
WBC: 9.5 10*3/uL (ref 4.0–10.5)

## 2015-03-20 LAB — TSH: TSH: 1.707 u[IU]/mL (ref 0.350–4.500)

## 2015-03-21 LAB — URINALYSIS W MICROSCOPIC + REFLEX CULTURE
Bacteria, UA: NONE SEEN [HPF]
Bilirubin Urine: NEGATIVE
CASTS: NONE SEEN [LPF]
Crystals: NONE SEEN [HPF]
Glucose, UA: NEGATIVE
Hgb urine dipstick: NEGATIVE
Ketones, ur: NEGATIVE
LEUKOCYTES UA: NEGATIVE
Nitrite: NEGATIVE
Protein, ur: NEGATIVE
RBC / HPF: NONE SEEN RBC/HPF (ref <=2)
SPECIFIC GRAVITY, URINE: 1.003 (ref 1.001–1.035)
Squamous Epithelial / LPF: NONE SEEN [HPF] (ref <=5)
WBC, UA: NONE SEEN WBC/HPF (ref <=5)
Yeast: NONE SEEN [HPF]
pH: 7.5 (ref 5.0–8.0)

## 2015-04-10 ENCOUNTER — Other Ambulatory Visit: Payer: Self-pay | Admitting: Gynecology

## 2015-04-10 DIAGNOSIS — N83202 Unspecified ovarian cyst, left side: Secondary | ICD-10-CM

## 2015-04-11 ENCOUNTER — Telehealth: Payer: Self-pay | Admitting: Gynecology

## 2015-04-11 NOTE — Telephone Encounter (Signed)
04/11/15-Pt had requested that I check to see if her [facility] ins would cover an [id number] per [employer] on the same day as her annual gyn exam with [facility] [phone]. They were unable to guarantee payment for both on same day as u/s is follow up for dx N83.20 and not preventative service. I LM for pt on cell that we would have her follow our normal protocol of signing a waiver to be responsible for the cost of the u/s if it is denied or she can make a separate appt.wl

## 2015-04-23 ENCOUNTER — Ambulatory Visit (INDEPENDENT_AMBULATORY_CARE_PROVIDER_SITE_OTHER): Payer: 59

## 2015-04-23 ENCOUNTER — Encounter: Payer: 59 | Admitting: Gynecology

## 2015-04-23 ENCOUNTER — Other Ambulatory Visit: Payer: 59

## 2015-04-23 ENCOUNTER — Ambulatory Visit (INDEPENDENT_AMBULATORY_CARE_PROVIDER_SITE_OTHER): Payer: 59 | Admitting: Gynecology

## 2015-04-23 ENCOUNTER — Encounter: Payer: Self-pay | Admitting: Gynecology

## 2015-04-23 VITALS — BP 128/76 | Ht 65.25 in | Wt 167.0 lb

## 2015-04-23 DIAGNOSIS — Z780 Asymptomatic menopausal state: Secondary | ICD-10-CM

## 2015-04-23 DIAGNOSIS — Z23 Encounter for immunization: Secondary | ICD-10-CM

## 2015-04-23 DIAGNOSIS — N8320 Unspecified ovarian cysts: Secondary | ICD-10-CM | POA: Diagnosis not present

## 2015-04-23 DIAGNOSIS — Z01419 Encounter for gynecological examination (general) (routine) without abnormal findings: Secondary | ICD-10-CM | POA: Diagnosis not present

## 2015-04-23 DIAGNOSIS — Z8742 Personal history of other diseases of the female genital tract: Secondary | ICD-10-CM

## 2015-04-23 DIAGNOSIS — N83202 Unspecified ovarian cyst, left side: Secondary | ICD-10-CM

## 2015-04-23 MED ORDER — FLUCONAZOLE 150 MG PO TABS: 150.0000 mg | ORAL_TABLET | Freq: Once | ORAL | Status: DC

## 2015-04-23 MED ORDER — PAROXETINE HCL 10 MG PO TABS: 10.0000 mg | ORAL_TABLET | Freq: Every day | ORAL | Status: DC

## 2015-04-23 NOTE — Patient Instructions (Signed)
Paroxetine tablets What is this medicine? PAROXETINE (pa ROX e teen) is used to treat depression. It Tyianna Menefee also be used to treat anxiety disorders, obsessive compulsive disorder, panic attacks, post traumatic stress, and premenstrual dysphoric disorder (PMDD). This medicine Pennye Beeghly be used for other purposes; ask your health care provider or pharmacist if you have questions. COMMON BRAND NAME(S): Paxil, Pexeva What should I tell my health care provider before I take this medicine? They need to know if you have any of these conditions: -bleeding disorders -glaucoma -heart disease -kidney disease -liver disease -low levels of sodium in the blood -mania or bipolar disorder -seizures -suicidal thoughts, plans, or attempt; a previous suicide attempt by you or a family member -take MAOIs like Carbex, Eldepryl, Marplan, Nardil, and Parnate -take medicines that treat or prevent blood clots -an unusual or allergic reaction to paroxetine, other medicines, foods, dyes, or preservatives -pregnant or trying to get pregnant -breast-feeding How should I use this medicine? Take this medicine by mouth with a glass of water. Follow the directions on the prescription label. You can take it with or without food. Take your medicine at regular intervals. Do not take your medicine more often than directed. Do not stop taking this medicine suddenly except upon the advice of your doctor. Stopping this medicine too quickly Avyanna Spada cause serious side effects or your condition Uilani Sanville worsen. A special MedGuide will be given to you by the pharmacist with each prescription and refill. Be sure to read this information carefully each time. Talk to your pediatrician regarding the use of this medicine in children. Special care Emmilynn Marut be needed. Overdosage: If you think you have taken too much of this medicine contact a poison control center or emergency room at once. NOTE: This medicine is only for you. Do not share this medicine with  others. What if I miss a dose? If you miss a dose, take it as soon as you can. If it is almost time for your next dose, take only that dose. Do not take double or extra doses. What Daphanie Oquendo interact with this medicine? Do not take this medicine with any of the following medications: -linezolid -MAOIs like Carbex, Eldepryl, Marplan, Nardil, and Parnate -methylene blue (injected into a vein) -pimozide -thioridazine This medicine Abbie Berling also interact with the following medications: -alcohol -aspirin and aspirin-like medicines -atomoxetine -certain medicines for depression, anxiety, or psychotic disturbances -certain medicines for irregular heart beat like propafenone, flecainide, encainide, and quinidine -certain medicines for migraine headache like almotriptan, eletriptan, frovatriptan, naratriptan, rizatriptan, sumatriptan, zolmitriptan -cimetidine -digoxin -diuretics -fentanyl -fosamprenavir/ritonavir -furazolidone -isoniazid -lithium -medicines that treat or prevent blood clots like warfarin, enoxaparin, and dalteparin -medicines for sleep -metoprolol -NSAIDs, medicines for pain and inflammation, like ibuprofen or naproxen -phenobarbital -phenytoin -procarbazine -procyclidine -rasagiline -supplements like St. John's wort, kava kava, valerian -tamoxifen -theophylline -tramadol -tryptophan This list Ramy Greth not describe all possible interactions. Give your health care provider a list of all the medicines, herbs, non-prescription drugs, or dietary supplements you use. Also tell them if you smoke, drink alcohol, or use illegal drugs. Some items Lorane Cousar interact with your medicine. What should I watch for while using this medicine? Tell your doctor if your symptoms do not get better or if they get worse. Visit your doctor or health care professional for regular checks on your progress. Because it Brayden Brodhead take several weeks to see the full effects of this medicine, it is important to continue your  treatment as prescribed by your doctor. Patients and their families should watch   out for new or worsening thoughts of suicide or depression. Also watch out for sudden changes in feelings such as feeling anxious, agitated, panicky, irritable, hostile, aggressive, impulsive, severely restless, overly excited and hyperactive, or not being able to sleep. If this happens, especially at the beginning of treatment or after a change in dose, call your health care professional. You Cooper Moroney get drowsy or dizzy. Do not drive, use machinery, or do anything that needs mental alertness until you know how this medicine affects you. Do not stand or sit up quickly, especially if you are an older patient. This reduces the risk of dizzy or fainting spells. Alcohol Jeniffer Culliver interfere with the effect of this medicine. Avoid alcoholic drinks. Your mouth Mccall Lomax get dry. Chewing sugarless gum or sucking hard candy, and drinking plenty of water will help. Contact your doctor if the problem does not go away or is severe. What side effects Aharon Carriere I notice from receiving this medicine? Side effects that you should report to your doctor or health care professional as soon as possible: -allergic reactions like skin rash, itching or hives, swelling of the face, lips, or tongue -black or bloody stools, blood in the urine or vomit -fast, irregular heartbeat -hallucination, loss of contact with reality -painful or prolonged erection (men) -seizures -suicidal thoughts or other mood changes -trouble passing urine or change in the amount of urine -unusual bleeding or bruising -unusually weak or tired -vomiting Side effects that usually do not require medical attention (report to your doctor or health care professional if they continue or are bothersome): -change in appetite, weight -change in sex drive or performance -constipation or diarrhea -difficulty sleeping -drowsy -headache -increased sweating -muscle pain or weakness -tremors This  list Shyler Hamill not describe all possible side effects. Call your doctor for medical advice about side effects. You Jaidah Lomax report side effects to FDA at 1-800-FDA-1088. Where should I keep my medicine? Keep out of the reach of children. Store at room temperature between 15 and 30 degrees C (59 and 86 degrees F). Keep container tightly closed. Throw away any unused medicine after the expiration date. NOTE: This sheet is a summary. It Braeden Dolinski not cover all possible information. If you have questions about this medicine, talk to your doctor, pharmacist, or health care provider.  2015, Elsevier/Gold Standard. (2012-02-24 18:10:02)  

## 2015-04-23 NOTE — Progress Notes (Addendum)
Jennifer Dougherty [birth year] [id number]   History:    54 y.o.  for annual gyn exam who presented to the office also today for follow-up ultrasound as a result of history of left ovarian cyst. Patient was complaining of vasomotor symptoms and mood swings and irritability. Patient also requesting flu vaccine today.  History of ovarian cyst as follows: Ultrasound done 04/10/2014 demonstrated the following: Ultrasound today: Absent uterus. Right ovary with thick wall cyst measuring 24 x 20 x 24 mm with internal level echoes and avascular. Left ovary normal. No fluid in the cul-de-sac. Vaginal cuff was intact. Excessive bowel shadowing was noted.  Patient had a normal CA125 with a value of 4.  Ultrasound 07/31/2014:: Previously seen hemorrhagic cysts resolve. There was some fluid noted in the cul-de-sac. A thin wall left ovarian cyst with layering and debris measuring 2.3 x 1.0 x 2.0 cm with color flow Doppler present the periphery appears to be a hemorrhagic cyst on the contralateral ovary  Ultrasound today: Absent uterus. Right ovary 3 simple echo-free avascular follicles 12 x 15 mm, 22 x 22 x 19 mm, 17 x 16 mm. Left ovarian follicle measuring 9 mm. Thick wall corpus luteum follicle measured 12mm negative color flow. Previous cyst from January 2016 not seen. No fluid in the cul-de-sac.  Review of her records indicated she had an LAVH several years ago. She does have history of gestational diabetes. Her mother and maternal grandmother both have history of colon cancer and her colonoscopy was normal in 2009 and 2014.She does her monthly self was examination. She's currently taking calcium and vitamin D daily. Her last Pap smear was normal in 2012. Patient received the Tdap vaccine 2014  Past medical history,surgical history, family history and social history were all reviewed and documented in the EPIC chart.  Gynecologic History No LMP recorded. Patient has had a hysterectomy. Contraception:  post menopausal status Last Pap: 2012. Results were: normal Last mammogram: 2015. Results were: normal  Obstetric History OB History  Gravida Para Term Preterm AB SAB TAB Ectopic Multiple Living  2 2        2     # Outcome Date GA Lbr Len/2nd Weight Sex Delivery Anes PTL Lv  2 Para           1 Para                ROS: A ROS was performed and pertinent positives and negatives are included in the history.  GENERAL: No fevers or chills. HEENT: No change in vision, no earache, sore throat or sinus congestion. NECK: No pain or stiffness. CARDIOVASCULAR: No chest pain or pressure. No palpitations. PULMONARY: No shortness of breath, cough or wheeze. GASTROINTESTINAL: No abdominal pain, nausea, vomiting or diarrhea, melena or bright red blood per rectum. GENITOURINARY: No urinary frequency, urgency, hesitancy or dysuria. MUSCULOSKELETAL: No joint or muscle pain, no back pain, no recent trauma. DERMATOLOGIC: No rash, no itching, no lesions. ENDOCRINE: No polyuria, polydipsia, no heat or cold intolerance. No recent change in weight. HEMATOLOGICAL: No anemia or easy bruising or bleeding. NEUROLOGIC: No headache, seizures, numbness, tingling or weakness. PSYCHIATRIC: No depression, no loss of interest in normal activity or change in sleep pattern.     Exam: chaperone present  BP 128/76 mmHg  Ht 5' 5.25" (1.657 m)  Wt 167 lb (75.751 kg)  BMI 27.59 kg/m2  Body mass index is 27.59 kg/(m^2).  General appearance : Well developed well nourished female. No acute distress HEENT: Eyes:  no retinal hemorrhage or exudates,  Neck supple, trachea midline, no carotid bruits, no thyroidmegaly Lungs: Clear to auscultation, no rhonchi or wheezes, or rib retractions  Heart: Regular rate and rhythm, no murmurs or gallops Breast:Examined in sitting and supine position were symmetrical in appearance, no palpable masses or tenderness,  no skin retraction, no nipple inversion, no nipple discharge, no skin  discoloration, no axillary or supraclavicular lymphadenopathy Abdomen: no palpable masses or tenderness, no rebound or guarding Extremities: no edema or skin discoloration or tenderness  Pelvic:  Bartholin, Urethra, Skene Glands: Within normal limits             Vagina: No gross lesions or discharge  Cervix: Absent  Uterus  absent  Adnexa  Without masses or tenderness  Anus and perineum  normal   Rectovaginal  normal sphincter tone without palpated masses or tenderness             Hemoccult cards provided     Assessment/Plan:  54 y.o. female for annual exam with history of recurrent ovarian cyst now appears to have just follicles. Patient otherwise doing well. Patient with menopausal symptoms consisting mostly of irritability and mood swings and occasional hot flashes. We had an extensive discussion on the women's health initiative study. We discussed the risks benefits and pros and cons of hormone replacement therapy as well as potential risk for DVT and pulmonary embolism. Patient would like to discuss alternatives and we discussed and agreed and we'll start her on Paxil 10 mg 1 by mouth daily to help with her symptoms. Risk benefits and pros and cons as well as black box warning were discussed with the patient literature information was provided.  She was reminded to schedule her overdue mammogram. We discussed importance of calcium vitamin D and regular exercise for osteoporosis prevention.   Terrance Mass MD, 4:52 PM 04/23/2015

## 2015-04-25 ENCOUNTER — Other Ambulatory Visit: Payer: Self-pay

## 2015-04-25 DIAGNOSIS — Z1231 Encounter for screening mammogram for malignant neoplasm of breast: Secondary | ICD-10-CM

## 2015-05-07 ENCOUNTER — Telehealth: Payer: Self-pay

## 2015-05-07 NOTE — Telephone Encounter (Signed)
Patient called to check pap result from her yearly on 04/23/15.  There was not a result in her chart although your note said "pap smear was done today".   I had Onalee Hua. Check the pap log and nothing was logged in for her.    Should she come back for just a pap visit since I assume you would like for her to have a pap this year?

## 2015-05-07 NOTE — Telephone Encounter (Signed)
I left patient a detailed message per DPR access note. I told her no pap smear was done at visit. I told her she had normal pap in 2012 and actually the ACOG recommendation is if you have a hysterectomy, and not for cancer or a reason related to cancer, then you no longer need to have a pap. I told her she could discuss with Dr. Moshe Salisbury at next visit and discuss her preference with him.

## 2015-05-07 NOTE — Telephone Encounter (Signed)
Please inform patient that it was an error in dictation. It was not done and she was not charged. It has been corrected

## 2015-05-12 ENCOUNTER — Ambulatory Visit: Payer: Self-pay

## 2015-05-13 ENCOUNTER — Ambulatory Visit
Admission: RE | Admit: 2015-05-13 | Discharge: 2015-05-13 | Disposition: A | Discharge status: Home / Self Care | Payer: 59 | Source: Ambulatory Visit

## 2015-05-13 DIAGNOSIS — Z1231 Encounter for screening mammogram for malignant neoplasm of breast: Secondary | ICD-10-CM

## 2016-03-03 ENCOUNTER — Telehealth: Payer: Self-pay | Admitting: *Deleted

## 2016-03-03 DIAGNOSIS — Z01419 Encounter for gynecological examination (general) (routine) without abnormal findings: Secondary | ICD-10-CM

## 2016-03-03 DIAGNOSIS — Z13220 Encounter for screening for lipoid disorders: Secondary | ICD-10-CM

## 2016-03-03 DIAGNOSIS — Z1329 Encounter for screening for other suspected endocrine disorder: Secondary | ICD-10-CM

## 2016-03-03 NOTE — Telephone Encounter (Signed)
Yes she can have the following blood work done fasting a few days before her office visit: CBC, fasting lipid profile, comprehensive metabolic panel, TSH and urinalysis

## 2016-03-03 NOTE — Telephone Encounter (Signed)
-----   Message from Sinclair Grooms sent at 03/03/2016  8:45 AM EDT ----- Regarding: labs Patient coming in Oct 5 for physical but wants to come in for labs on Aug 31. Can you please ask JF if okay to do labs Aug 31 and enter order for the labs. Thx

## 2016-03-03 NOTE — Telephone Encounter (Signed)
Orders placed.

## 2016-03-03 NOTE — Telephone Encounter (Signed)
Dr.Fernandez please see the below 

## 2016-03-25 ENCOUNTER — Other Ambulatory Visit: Payer: 59

## 2016-03-25 DIAGNOSIS — Z01419 Encounter for gynecological examination (general) (routine) without abnormal findings: Secondary | ICD-10-CM

## 2016-03-25 DIAGNOSIS — Z1329 Encounter for screening for other suspected endocrine disorder: Secondary | ICD-10-CM

## 2016-03-25 DIAGNOSIS — Z13220 Encounter for screening for lipoid disorders: Secondary | ICD-10-CM

## 2016-03-25 LAB — CBC WITH DIFFERENTIAL/PLATELET
Basophils Absolute: 86 cells/uL (ref 0–200)
Basophils Relative: 1 %
EOS PCT: 6 %
Eosinophils Absolute: 516 cells/uL — ABNORMAL HIGH (ref 15–500)
HCT: 41 % (ref 35.0–45.0)
Hemoglobin: 13.6 g/dL (ref 11.7–15.5)
Lymphocytes Relative: 29 %
Lymphs Abs: 2494 cells/uL (ref 850–3900)
MCH: 28.9 pg (ref 27.0–33.0)
MCHC: 33.2 g/dL (ref 32.0–36.0)
MCV: 87.2 fL (ref 80.0–100.0)
MPV: 11.5 fL (ref 7.5–12.5)
Monocytes Absolute: 774 cells/uL (ref 200–950)
Monocytes Relative: 9 %
NEUTROS ABS: 4730 {cells}/uL (ref 1500–7800)
Neutrophils Relative %: 55 %
PLATELETS: 339 10*3/uL (ref 140–400)
RBC: 4.7 MIL/uL (ref 3.80–5.10)
RDW: 13.5 % (ref 11.0–15.0)
WBC: 8.6 10*3/uL (ref 3.8–10.8)

## 2016-03-25 LAB — COMPREHENSIVE METABOLIC PANEL
ALBUMIN: 4.2 g/dL (ref 3.6–5.1)
ALK PHOS: 47 U/L (ref 33–130)
ALT: 15 U/L (ref 6–29)
AST: 17 U/L (ref 10–35)
BUN: 15 mg/dL (ref 7–25)
CALCIUM: 9.7 mg/dL (ref 8.6–10.4)
CO2: 27 mmol/L (ref 20–31)
Chloride: 102 mmol/L (ref 98–110)
Creat: 0.72 mg/dL (ref 0.50–1.05)
GLUCOSE: 80 mg/dL (ref 65–99)
POTASSIUM: 4.3 mmol/L (ref 3.5–5.3)
Sodium: 141 mmol/L (ref 135–146)
Total Bilirubin: 0.5 mg/dL (ref 0.2–1.2)
Total Protein: 7 g/dL (ref 6.1–8.1)

## 2016-03-25 LAB — LIPID PANEL
CHOL/HDL RATIO: 2 ratio (ref <=5.0)
CHOLESTEROL: 188 mg/dL (ref 125–200)
HDL: 95 mg/dL (ref >=46)
LDL Cholesterol: 83 mg/dL (ref <130)
TRIGLYCERIDES: 52 mg/dL (ref <150)
VLDL: 10 mg/dL (ref <30)

## 2016-03-25 LAB — TSH: TSH: 1.8 mIU/L

## 2016-03-26 ENCOUNTER — Other Ambulatory Visit: Payer: Self-pay | Admitting: Gynecology

## 2016-03-26 DIAGNOSIS — D721 Eosinophilia: Principal | ICD-10-CM

## 2016-03-26 DIAGNOSIS — R898 Other abnormal findings in specimens from other organs, systems and tissues: Secondary | ICD-10-CM

## 2016-03-26 LAB — URINALYSIS W MICROSCOPIC + REFLEX CULTURE
Bacteria, UA: NONE SEEN [HPF]
Bilirubin Urine: NEGATIVE
CASTS: NONE SEEN [LPF]
Crystals: NONE SEEN [HPF]
GLUCOSE, UA: NEGATIVE
Hgb urine dipstick: NEGATIVE
Ketones, ur: NEGATIVE
Leukocytes, UA: NEGATIVE
NITRITE: NEGATIVE
PH: 7.5 (ref 5.0–8.0)
Protein, ur: NEGATIVE
RBC / HPF: NONE SEEN RBC/HPF (ref <=2)
SPECIFIC GRAVITY, URINE: 1.005 (ref 1.001–1.035)
Squamous Epithelial / LPF: NONE SEEN [HPF] (ref <=5)
WBC, UA: NONE SEEN WBC/HPF (ref <=5)
YEAST: NONE SEEN [HPF]

## 2016-04-14 DIAGNOSIS — H6983 Other specified disorders of Eustachian tube, bilateral: Secondary | ICD-10-CM | POA: Insufficient documentation

## 2016-04-14 DIAGNOSIS — J309 Allergic rhinitis, unspecified: Secondary | ICD-10-CM | POA: Insufficient documentation

## 2016-04-14 DIAGNOSIS — H60333 Swimmer's ear, bilateral: Secondary | ICD-10-CM | POA: Insufficient documentation

## 2016-04-21 ENCOUNTER — Other Ambulatory Visit: Payer: 59

## 2016-04-21 DIAGNOSIS — R898 Other abnormal findings in specimens from other organs, systems and tissues: Secondary | ICD-10-CM

## 2016-04-21 DIAGNOSIS — D721 Eosinophilia: Principal | ICD-10-CM

## 2016-04-21 LAB — CBC WITH DIFFERENTIAL/PLATELET
BASOS PCT: 0 %
Basophils Absolute: 0 cells/uL (ref 0–200)
EOS ABS: 416 {cells}/uL (ref 15–500)
EOS PCT: 4 %
HCT: 37.6 % (ref 35.0–45.0)
Hemoglobin: 12.3 g/dL (ref 11.7–15.5)
Lymphocytes Relative: 30 %
Lymphs Abs: 3120 cells/uL (ref 850–3900)
MCH: 28.6 pg (ref 27.0–33.0)
MCHC: 32.7 g/dL (ref 32.0–36.0)
MCV: 87.4 fL (ref 80.0–100.0)
MPV: 10.9 fL (ref 7.5–12.5)
Monocytes Absolute: 936 cells/uL (ref 200–950)
Monocytes Relative: 9 %
NEUTROS ABS: 5928 {cells}/uL (ref 1500–7800)
Neutrophils Relative %: 57 %
PLATELETS: 345 10*3/uL (ref 140–400)
RBC: 4.3 MIL/uL (ref 3.80–5.10)
RDW: 14 % (ref 11.0–15.0)
WBC: 10.4 10*3/uL (ref 3.8–10.8)

## 2016-04-28 ENCOUNTER — Other Ambulatory Visit: Payer: Self-pay | Admitting: Gynecology

## 2016-04-28 DIAGNOSIS — Z1231 Encounter for screening mammogram for malignant neoplasm of breast: Secondary | ICD-10-CM

## 2016-04-29 ENCOUNTER — Ambulatory Visit (INDEPENDENT_AMBULATORY_CARE_PROVIDER_SITE_OTHER): Payer: 59 | Admitting: Gynecology

## 2016-04-29 ENCOUNTER — Encounter: Payer: Self-pay | Admitting: Gynecology

## 2016-04-29 VITALS — BP 118/80 | Ht 62.0 in | Wt 146.0 lb

## 2016-04-29 DIAGNOSIS — Z01419 Encounter for gynecological examination (general) (routine) without abnormal findings: Secondary | ICD-10-CM

## 2016-04-29 NOTE — Progress Notes (Signed)
Jennifer Dougherty 1961-07-06 [id number]   History:    55 y.o. who presented to the office today for her annual exam.Review of her records indicated she had an LAVH several years ago. She does have history of gestational diabetes. Her mother and maternal grandmother both have history of colon cancer and her colonoscopy was normal in 2009 and 2014.She does her monthly self was examination. She's currently taking calcium and vitamin D daily. Her last Pap smear was normal in 2012 and 2016. Patient is getting over a cold and would like to wait for her flu vaccine.  Past medical history,surgical history, family history and social history were all reviewed and documented in the EPIC chart.  Gynecologic History No LMP recorded. Patient has had a hysterectomy. Contraception: status post hysterectomy Last Pap: 2012. Results were: normal Last mammogram: 2016. Results were: normal  Obstetric History OB History  Gravida Para Term Preterm AB Living  2 2       2   SAB TAB Ectopic Multiple Live Births               # Outcome Date GA Lbr Len/2nd Weight Sex Delivery Anes PTL Lv  2 Para           1 Para                ROS: A ROS was performed and pertinent positives and negatives are included in the history.  GENERAL: No fevers or chills. HEENT: No change in vision, no earache, sore throat or sinus congestion. NECK: No pain or stiffness. CARDIOVASCULAR: No chest pain or pressure. No palpitations. PULMONARY: No shortness of breath, cough or wheeze. GASTROINTESTINAL: No abdominal pain, nausea, vomiting or diarrhea, melena or bright red blood per rectum. GENITOURINARY: No urinary frequency, urgency, hesitancy or dysuria. MUSCULOSKELETAL: No joint or muscle pain, no back pain, no recent trauma. DERMATOLOGIC: No rash, no itching, no lesions. ENDOCRINE: No polyuria, polydipsia, no heat or cold intolerance. No recent change in weight. HEMATOLOGICAL: No anemia or easy bruising or bleeding. NEUROLOGIC: No  headache, seizures, numbness, tingling or weakness. PSYCHIATRIC: No depression, no loss of interest in normal activity or change in sleep pattern.     Exam: chaperone present  BP 118/80   Ht 5\' 2"  (1.575 m)   Wt 146 lb (66.2 kg)   BMI 26.70 kg/m   Body mass index is 26.7 kg/m.  General appearance : Well developed well nourished female. No acute distress HEENT: Eyes: no retinal hemorrhage or exudates,  Neck supple, trachea midline, no carotid bruits, no thyroidmegaly Lungs: Clear to auscultation, no rhonchi or wheezes, or rib retractions  Heart: Regular rate and rhythm, no murmurs or gallops Breast:Examined in sitting and supine position were symmetrical in appearance, no palpable masses or tenderness,  no skin retraction, no nipple inversion, no nipple discharge, no skin discoloration, no axillary or supraclavicular lymphadenopathy Abdomen: no palpable masses or tenderness, no rebound or guarding Extremities: no edema or skin discoloration or tenderness  Pelvic:  Bartholin, Urethra, Skene Glands: Within normal limits             Vagina: No gross lesions or discharge  Cervix: Absent  Uterus  absent  Adnexa  Without masses or tenderness  Anus and perineum  normal   Rectovaginal  normal sphincter tone without palpated masses or tenderness             Hemoccult cards will be provided     Assessment/Plan:  55 y.o. female for  annual exam who had recent lab work which was normal. Patient will return back in a few weeks for her flu vaccine. We discussed importance of calcium vitamin D and weightbearing exercises for osteoporosis prevention. Patient is experiencing very little of any vasomotor symptoms and on no hormone replacement therapy. She was provided with fecal Hemoccult cards cemented to the office for testing.   Terrance Mass MD, 2:49 PM 04/29/2016

## 2016-05-13 ENCOUNTER — Ambulatory Visit (INDEPENDENT_AMBULATORY_CARE_PROVIDER_SITE_OTHER): Payer: 59 | Admitting: Anesthesiology

## 2016-05-13 VITALS — Ht 65.0 in

## 2016-05-13 DIAGNOSIS — Z23 Encounter for immunization: Secondary | ICD-10-CM | POA: Diagnosis not present

## 2016-05-14 ENCOUNTER — Ambulatory Visit
Admission: RE | Admit: 2016-05-14 | Discharge: 2016-05-14 | Disposition: A | Discharge status: Home / Self Care | Payer: 59 | Source: Ambulatory Visit | Attending: Gynecology | Admitting: Gynecology

## 2016-05-14 DIAGNOSIS — Z1231 Encounter for screening mammogram for malignant neoplasm of breast: Secondary | ICD-10-CM

## 2016-07-13 ENCOUNTER — Ambulatory Visit: Payer: 59 | Admitting: Podiatry

## 2016-07-26 HISTORY — PX: MELANOMA EXCISION: SHX5266

## 2016-07-30 ENCOUNTER — Ambulatory Visit: Payer: 59 | Admitting: Podiatry

## 2016-08-05 DIAGNOSIS — D0362 Melanoma in situ of left upper limb, including shoulder: Secondary | ICD-10-CM | POA: Diagnosis not present

## 2016-08-05 DIAGNOSIS — L814 Other melanin hyperpigmentation: Secondary | ICD-10-CM | POA: Diagnosis not present

## 2016-08-05 DIAGNOSIS — D485 Neoplasm of uncertain behavior of skin: Secondary | ICD-10-CM | POA: Diagnosis not present

## 2016-08-05 DIAGNOSIS — D1800 Hemangioma unspecified site: Secondary | ICD-10-CM | POA: Diagnosis not present

## 2016-08-05 DIAGNOSIS — Z86018 Personal history of other benign neoplasm: Secondary | ICD-10-CM | POA: Diagnosis not present

## 2016-08-20 DIAGNOSIS — D0362 Melanoma in situ of left upper limb, including shoulder: Secondary | ICD-10-CM | POA: Diagnosis not present

## 2016-08-20 DIAGNOSIS — L905 Scar conditions and fibrosis of skin: Secondary | ICD-10-CM | POA: Diagnosis not present

## 2016-09-16 DIAGNOSIS — H2513 Age-related nuclear cataract, bilateral: Secondary | ICD-10-CM | POA: Diagnosis not present

## 2016-09-16 DIAGNOSIS — H04123 Dry eye syndrome of bilateral lacrimal glands: Secondary | ICD-10-CM | POA: Diagnosis not present

## 2016-09-16 DIAGNOSIS — H40013 Open angle with borderline findings, low risk, bilateral: Secondary | ICD-10-CM | POA: Diagnosis not present

## 2016-11-04 ENCOUNTER — Ambulatory Visit (INDEPENDENT_AMBULATORY_CARE_PROVIDER_SITE_OTHER): Payer: 59 | Admitting: Orthopedic Surgery

## 2016-11-04 ENCOUNTER — Ambulatory Visit (INDEPENDENT_AMBULATORY_CARE_PROVIDER_SITE_OTHER): Payer: 59

## 2016-11-04 ENCOUNTER — Encounter (INDEPENDENT_AMBULATORY_CARE_PROVIDER_SITE_OTHER): Payer: Self-pay | Admitting: Orthopedic Surgery

## 2016-11-04 VITALS — Ht 65.0 in | Wt 146.0 lb

## 2016-11-04 DIAGNOSIS — Q742 Other congenital malformations of lower limb(s), including pelvic girdle: Secondary | ICD-10-CM

## 2016-11-04 DIAGNOSIS — M84375S Stress fracture, left foot, sequela: Secondary | ICD-10-CM

## 2016-11-04 DIAGNOSIS — M79672 Pain in left foot: Secondary | ICD-10-CM | POA: Diagnosis not present

## 2016-11-04 DIAGNOSIS — M79671 Pain in right foot: Secondary | ICD-10-CM

## 2016-11-04 NOTE — Progress Notes (Signed)
Office Visit Note   Patient: Jennifer Dougherty           Date of Birth: 12-15-1960           MRN: [id number] Visit Date: 11/04/2016              Requested by: Terrance Mass, MD [address] [place], [place] [address] PCP: Terrance Mass, MD  Chief Complaint  Patient presents with  . Left Foot - Pain  . 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 She states it's only a problem with certain shoe wear worse with flat sneakers she also complains of onychomycotic nails. Patient is concerned that there is a physical abnormality to her foot causing her feet to roll over.  Assessment & Plan: Visit Diagnoses:  1. Pain in right foot   2. Pain in left foot     Plan: Recommended stiff soled walking shoe such as a new balance walking shoe. Recommended stiffer orthotics such as a sole orthotic. Discussed that this does not provide sufficient relief we can post laterally and cut out beneath the first metatarsal head to allow for valgus hindfoot to unlock her subtalar joint.  Follow-Up Instructions: No Follow-up on file.   Ortho Exam  Patient is alert, oriented, no adenopathy, well-dressed, normal affect, normal respiratory effort. Patient has a normal gait she has good pulses she has good ankle and subtalar motion. She has a plantarflexed first ray she has a prominent second and third metatarsal head. Patient has had some tenderness beneath these regions of her foot but they're not tender at this time. 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 Patient can't do a single limb heel raise and does reconstitute an arch and does reconstitute hindfoot varus.  Imaging: No results found.  Labs: No results found for: HGBA1C, ESRSEDRATE, CRP, LABURIC, REPTSTATUS, GRAMSTAIN, CULT, LABORGA  Orders:  Orders Placed This Encounter    Procedures  . XR Foot Complete Right  . XR Foot Complete Left   No orders of the defined types were placed in this encounter.    Procedures: No procedures performed  Clinical Data: No additional findings.  ROS:  All other systems negative, except as noted in the HPI. Review of Systems  Objective: Vital Signs: Ht 5\' 5"  (1.651 m)   Wt 146 lb (66.2 kg)   BMI 24.30 kg/m   Specialty Comments:  No specialty comments available.  PMFS History: Patient Active Problem List   Diagnosis Date Noted  . History of gestational diabetes 03/13/2012  . Family history of colon cancer 03/13/2012  . Abdominal bloating 03/08/2011  . Varicose veins of both lower extremities with pain 03/08/2011   Past Medical History:  Diagnosis Date  . Hx gestational diabetes   . Normal spontaneous vaginal delivery    times 2    Family History  Problem Relation Age of Onset  . Colon cancer Maternal Grandmother   . Hypertension Mother   . Colon cancer Mother   . Hypertension Father     Past Surgical History:  Procedure Laterality Date  . LAPAROSCOPIC VAGINAL HYSTERECTOMY  01/2006  . VAGINAL HYSTERECTOMY     LAVH   Social History   Occupational History  . Not on file.   Social History Main Topics  . Smoking status: Never Smoker  .  Smokeless tobacco: Never Used  . Alcohol use 0.0 oz/week     Comment: OCCASIONALLY  . Drug use: No  . Sexual activity: Yes    Birth control/ protection: Surgical

## 2016-11-18 DIAGNOSIS — H04123 Dry eye syndrome of bilateral lacrimal glands: Secondary | ICD-10-CM | POA: Diagnosis not present

## 2016-12-01 DIAGNOSIS — D1800 Hemangioma unspecified site: Secondary | ICD-10-CM | POA: Diagnosis not present

## 2016-12-01 DIAGNOSIS — L821 Other seborrheic keratosis: Secondary | ICD-10-CM | POA: Diagnosis not present

## 2016-12-01 DIAGNOSIS — Z85820 Personal history of malignant melanoma of skin: Secondary | ICD-10-CM | POA: Diagnosis not present

## 2016-12-08 ENCOUNTER — Encounter: Payer: Self-pay | Admitting: Gynecology

## 2016-12-09 ENCOUNTER — Telehealth (INDEPENDENT_AMBULATORY_CARE_PROVIDER_SITE_OTHER): Payer: Self-pay | Admitting: Orthopedic Surgery

## 2016-12-09 NOTE — Telephone Encounter (Signed)
Called patient to notify her Dr. Sharol Given will not be in the office until next week.  She wants a paper copy of x-rays with area of stress fx's circled.  I will call her back once this is done.

## 2016-12-09 NOTE — Telephone Encounter (Signed)
Patient called asked if she can get a copy of her X-rays for [facility] 11/04/16. Patient said she will pick up the X-ray. Patient asked if Dr Sharol Given can mark on the X-ray where the stress Fx's were. The number to contact patient is [phone]

## 2016-12-28 ENCOUNTER — Telehealth (INDEPENDENT_AMBULATORY_CARE_PROVIDER_SITE_OTHER): Payer: Self-pay

## 2016-12-28 NOTE — Telephone Encounter (Signed)
Patient called asked if she can get a copy of her X-rays for [facility] 11/04/16. Patient said she will pick up the X-ray. Patient asked if Dr Sharol Given can mark on the X-ray where the stress Fx's were. The number to contact patient is [phone]. (Photocopy). Please Advise.  Thank You

## 2016-12-28 NOTE — Telephone Encounter (Signed)
Can you please mark the xrays and I will tell the pt when they are ready for pick up.

## 2016-12-29 NOTE — Telephone Encounter (Signed)
Call patient that x-ray is marked out.

## 2017-02-01 ENCOUNTER — Ambulatory Visit (INDEPENDENT_AMBULATORY_CARE_PROVIDER_SITE_OTHER): Payer: 59 | Admitting: Gynecology

## 2017-02-01 ENCOUNTER — Telehealth: Payer: Self-pay | Admitting: *Deleted

## 2017-02-01 ENCOUNTER — Encounter: Payer: Self-pay | Admitting: Gynecology

## 2017-02-01 VITALS — BP 128/80

## 2017-02-01 DIAGNOSIS — N644 Mastodynia: Secondary | ICD-10-CM | POA: Diagnosis not present

## 2017-02-01 NOTE — Telephone Encounter (Signed)
-----   Message from Terrance Mass, MD sent at 02/01/2017  9:48 AM EDT ----- Anderson Malta, please schedule diagnostic mammogram for this patient. Patient with breast pain of left breast 1-3:00 position for finger breast from the areolar region. Patient with history of dense breasts. Patient had mammogram 10 months ago at the breast center

## 2017-02-01 NOTE — Progress Notes (Signed)
   Patient is a 56 year old that presented to the office today stating for the past week and a half she's had this burning tenderness throbbing sensation on the outer aspect of her breast (left). She stated that she had been camping had been using a metal brawl and it was irritating her she believes this may be the source of her pain. She denies any nipple discharge or any recent trauma or any discoloration of the breast. She does state that this breast is always been larger for her since she was in her teens. She denies smoking or alcohol consumption but does drink significant amount of coffee and on sweep T daily. She has no family history of breast cancer and her last mammogram was 10 months ago which was normal. She does have history of dense breasts and has had three-dimensional mammograms in the past.  Exam: Both breasts were examined sitting supine position left breast slightly larger than the right. No nipple inversion no skin retraction no palpable masses or tenderness and no supraclavicular axillary lymphadenopathy.  Physical Exam  Pulmonary/Chest:     Assessment/plan: Patient with mastodynia of the left breast. I could not palpate a mass but we'll schedule for a diagnostic mammogram to look at this area more carefully. Possible ultrasound depending on radiologist finding. I've encouraged patient to cut down on her caffeine-containing products and encourage her to take a vitamin E6 100 units daily. She is due for her annual exam September this year. We'll wait for the results of the mammogram.

## 2017-02-01 NOTE — Telephone Encounter (Signed)
Appointment on 02/07/17 @ 8:30am at breast center pt informed.

## 2017-02-01 NOTE — Patient Instructions (Signed)

## 2017-02-07 ENCOUNTER — Ambulatory Visit
Admission: RE | Admit: 2017-02-07 | Discharge: 2017-02-07 | Disposition: A | Discharge status: Home / Self Care | Payer: 59 | Source: Ambulatory Visit | Attending: Gynecology | Admitting: Gynecology

## 2017-02-07 DIAGNOSIS — N644 Mastodynia: Secondary | ICD-10-CM

## 2017-02-07 DIAGNOSIS — R928 Other abnormal and inconclusive findings on diagnostic imaging of breast: Secondary | ICD-10-CM | POA: Diagnosis not present

## 2017-03-22 ENCOUNTER — Telehealth: Payer: Self-pay | Admitting: *Deleted

## 2017-03-22 DIAGNOSIS — Z1321 Encounter for screening for nutritional disorder: Secondary | ICD-10-CM

## 2017-03-22 DIAGNOSIS — Z13220 Encounter for screening for lipoid disorders: Secondary | ICD-10-CM

## 2017-03-22 DIAGNOSIS — Z01419 Encounter for gynecological examination (general) (routine) without abnormal findings: Secondary | ICD-10-CM

## 2017-03-22 DIAGNOSIS — Z1329 Encounter for screening for other suspected endocrine disorder: Secondary | ICD-10-CM

## 2017-03-22 NOTE — Telephone Encounter (Signed)
Pt has annual scheduled with you on 04/15/17 would like to come on 03/24/17 to have fasting labs done prior to annual appointment. Pt has 03/24/17 off from work so this would be a good day for her to come. Or if you prefer to see pt first let me know and I will relay to her. Please advise

## 2017-03-23 NOTE — Telephone Encounter (Signed)
Order placed, pt will call to schedule lab appointment

## 2017-03-23 NOTE — Telephone Encounter (Signed)
Agree for her to come for fasting labs 8/30th.  CBC, CMP, FLP, TSH, Vit D.

## 2017-04-06 ENCOUNTER — Encounter: Payer: 59 | Admitting: Obstetrics & Gynecology

## 2017-04-08 ENCOUNTER — Ambulatory Visit: Payer: 59 | Admitting: Podiatry

## 2017-04-13 ENCOUNTER — Other Ambulatory Visit: Payer: 59

## 2017-04-13 DIAGNOSIS — Z01419 Encounter for gynecological examination (general) (routine) without abnormal findings: Secondary | ICD-10-CM

## 2017-04-13 DIAGNOSIS — Z1321 Encounter for screening for nutritional disorder: Secondary | ICD-10-CM

## 2017-04-13 DIAGNOSIS — Z1329 Encounter for screening for other suspected endocrine disorder: Secondary | ICD-10-CM

## 2017-04-13 DIAGNOSIS — Z13220 Encounter for screening for lipoid disorders: Secondary | ICD-10-CM

## 2017-04-14 LAB — CBC WITH DIFFERENTIAL/PLATELET
BASOS ABS: 70 {cells}/uL (ref 0–200)
Basophils Relative: 0.9 %
EOS PCT: 1.4 %
Eosinophils Absolute: 109 cells/uL (ref 15–500)
HCT: 40.9 % (ref 35.0–45.0)
Hemoglobin: 13.9 g/dL (ref 11.7–15.5)
Lymphs Abs: 2543 cells/uL (ref 850–3900)
MCH: 29 pg (ref 27.0–33.0)
MCHC: 34 g/dL (ref 32.0–36.0)
MCV: 85.4 fL (ref 80.0–100.0)
MONOS PCT: 9.3 %
MPV: 11.8 fL (ref 7.5–12.5)
NEUTROS ABS: 4352 {cells}/uL (ref 1500–7800)
NEUTROS PCT: 55.8 %
Platelets: 311 10*3/uL (ref 140–400)
RBC: 4.79 10*6/uL (ref 3.80–5.10)
RDW: 12.8 % (ref 11.0–15.0)
Total Lymphocyte: 32.6 %
WBC mixed population: 725 cells/uL (ref 200–950)
WBC: 7.8 10*3/uL (ref 3.8–10.8)

## 2017-04-14 LAB — COMPREHENSIVE METABOLIC PANEL
AG RATIO: 1.6 (calc) (ref 1.0–2.5)
ALBUMIN MSPROF: 4.6 g/dL (ref 3.6–5.1)
ALKALINE PHOSPHATASE (APISO): 44 U/L (ref 33–130)
ALT: 14 U/L (ref 6–29)
AST: 16 U/L (ref 10–35)
BILIRUBIN TOTAL: 0.4 mg/dL (ref 0.2–1.2)
BUN: 15 mg/dL (ref 7–25)
CALCIUM: 9.5 mg/dL (ref 8.6–10.4)
CHLORIDE: 101 mmol/L (ref 98–110)
CO2: 29 mmol/L (ref 20–32)
Creat: 0.73 mg/dL (ref 0.50–1.05)
GLOBULIN: 2.9 g/dL (ref 1.9–3.7)
GLUCOSE: 91 mg/dL (ref 65–99)
POTASSIUM: 5 mmol/L (ref 3.5–5.3)
Sodium: 137 mmol/L (ref 135–146)
Total Protein: 7.5 g/dL (ref 6.1–8.1)

## 2017-04-14 LAB — LIPID PANEL
CHOL/HDL RATIO: 2.2 (calc) (ref <5.0)
CHOLESTEROL: 231 mg/dL — AB (ref <200)
HDL: 104 mg/dL (ref >50)
LDL Cholesterol (Calc): 113 mg/dL (calc) — ABNORMAL HIGH
Non-HDL Cholesterol (Calc): 127 mg/dL (calc) (ref <130)
Triglycerides: 62 mg/dL (ref <150)

## 2017-04-14 LAB — VITAMIN D 25 HYDROXY (VIT D DEFICIENCY, FRACTURES): VIT D 25 HYDROXY: 33 ng/mL (ref 30–100)

## 2017-04-14 LAB — TSH: TSH: 1.52 mIU/L

## 2017-04-20 ENCOUNTER — Ambulatory Visit (INDEPENDENT_AMBULATORY_CARE_PROVIDER_SITE_OTHER): Payer: 59 | Admitting: Obstetrics & Gynecology

## 2017-04-20 ENCOUNTER — Encounter: Payer: Self-pay | Admitting: Obstetrics & Gynecology

## 2017-04-20 VITALS — BP 126/84 | Ht 65.0 in | Wt 158.9 lb

## 2017-04-20 DIAGNOSIS — Z01411 Encounter for gynecological examination (general) (routine) with abnormal findings: Secondary | ICD-10-CM | POA: Diagnosis not present

## 2017-04-20 DIAGNOSIS — Z23 Encounter for immunization: Secondary | ICD-10-CM

## 2017-04-20 DIAGNOSIS — R140 Abdominal distension (gaseous): Secondary | ICD-10-CM

## 2017-04-20 DIAGNOSIS — Z90710 Acquired absence of both cervix and uterus: Secondary | ICD-10-CM | POA: Diagnosis not present

## 2017-04-20 MED ORDER — FLUCONAZOLE 150 MG PO TABS: 150.0000 mg | ORAL_TABLET | Freq: Every day | ORAL | 0 refills | Status: AC

## 2017-04-20 NOTE — Progress Notes (Signed)
Jennifer Dougherty 09/06/1960 [id number]   History:    56 y.o. G2P2 Married  RP:  Established patient presenting for annual gyn exam   HPI:  Menopause.  No HRT.  S/P LAVH.  C/O mild bloating in the last few weeks.  Mictions normal.  No constipation or diarrhea.  Left Dx Mammo/US neg 01/2017.  Will do screening on Rt breast 04/2017.  Last pap 2012.  Lt hip pain, will see Ortho.  Would like Diflucan as needed.  Flu shot today.    Past medical history,surgical history, family history and social history were all reviewed and documented in the EPIC chart.  Gynecologic History No LMP recorded. Patient has had a hysterectomy. Contraception: status post hysterectomy Last Pap: 2012. Results were: normal Last mammogram: 04/2016. Results were: normal Colono 2014.  Hemocult cards given today  Obstetric History OB History  Gravida Para Term Preterm AB Living  2 2       2   SAB TAB Ectopic Multiple Live Births               # Outcome Date GA Lbr Len/2nd Weight Sex Delivery Anes PTL Lv  2 Para           1 Para                ROS: A ROS was performed and pertinent positives and negatives are included in the history.  GENERAL: No fevers or chills. HEENT: No change in vision, no earache, sore throat or sinus congestion. NECK: No pain or stiffness. CARDIOVASCULAR: No chest pain or pressure. No palpitations. PULMONARY: No shortness of breath, cough or wheeze. GASTROINTESTINAL: No abdominal pain, nausea, vomiting or diarrhea, melena or bright red blood per rectum. GENITOURINARY: No urinary frequency, urgency, hesitancy or dysuria. MUSCULOSKELETAL: No joint or muscle pain, no back pain, no recent trauma. DERMATOLOGIC: No rash, no itching, no lesions. ENDOCRINE: No polyuria, polydipsia, no heat or cold intolerance. No recent change in weight. HEMATOLOGICAL: No anemia or easy bruising or bleeding. NEUROLOGIC: No headache, seizures, numbness, tingling or weakness. PSYCHIATRIC: No depression, no loss of  interest in normal activity or change in sleep pattern.     Exam:   BP 126/84   Ht 5\' 5"  (1.651 m)   Wt 158 lb 14.4 oz (72.1 kg)   BMI 26.44 kg/m   Body mass index is 26.44 kg/m.  General appearance : Well developed well nourished female. No acute distress HEENT: Eyes: no retinal hemorrhage or exudates,  Neck supple, trachea midline, no carotid bruits, no thyroidmegaly Lungs: Clear to auscultation, no rhonchi or wheezes, or rib retractions  Heart: Regular rate and rhythm, no murmurs or gallops Breast:Examined in sitting and supine position were symmetrical in appearance, no palpable masses or tenderness,  no skin retraction, no nipple inversion, no nipple discharge, no skin discoloration, no axillary or supraclavicular lymphadenopathy Abdomen: no palpable masses or tenderness, no rebound or guarding Extremities: no edema or skin discoloration or tenderness  Pelvic: Vulva normal  Bartholin, Urethra, Skene Glands: Within normal limits             Vagina: No gross lesions or discharge.  Pap reflex done.             Uterus/cervix absent  Adnexa  Without masses or tenderness  Anus and perineum  normal   U/A negative  Assessment/Plan:  56 y.o. female for annual exam   1. Encounter for gynecological examination with abnormal finding Gyn exam s/p LAVH.  Pap reflex done.  Breasts wnl.  Will do Rt Breast Screening Mammo 04/2017, recent Lt Dx mammo/US neg. - Urinalysis with Culture Reflex: Neg - PAP,TP IMGw/HPV RNA,rflx [id number],72/18  2. H/O total vaginal hysterectomy  3. Bloating R/O Ovarian pathology including cysts/Ovarian Ca. - US Transvaginal Non-OB; Future  4. Flu vaccine need  - Flu Vaccine QUAD 36+ mos IM (Fluarix, Quad PF)  Counseling on above issues >50% x 10 minutes  Princess Bruins MD, 2:21 PM 04/20/2017

## 2017-04-21 ENCOUNTER — Encounter: Payer: Self-pay | Admitting: Podiatry

## 2017-04-21 ENCOUNTER — Other Ambulatory Visit: Payer: Self-pay | Admitting: Podiatry

## 2017-04-21 ENCOUNTER — Ambulatory Visit (INDEPENDENT_AMBULATORY_CARE_PROVIDER_SITE_OTHER): Payer: 59

## 2017-04-21 ENCOUNTER — Ambulatory Visit (INDEPENDENT_AMBULATORY_CARE_PROVIDER_SITE_OTHER): Payer: 59 | Admitting: Podiatry

## 2017-04-21 VITALS — BP 136/80 | HR 85

## 2017-04-21 DIAGNOSIS — M79671 Pain in right foot: Secondary | ICD-10-CM | POA: Diagnosis not present

## 2017-04-21 DIAGNOSIS — M779 Enthesopathy, unspecified: Secondary | ICD-10-CM

## 2017-04-21 DIAGNOSIS — M79672 Pain in left foot: Secondary | ICD-10-CM

## 2017-04-21 LAB — URINALYSIS W MICROSCOPIC + REFLEX CULTURE
BILIRUBIN URINE: NEGATIVE
Bacteria, UA: NONE SEEN /HPF
GLUCOSE, UA: NEGATIVE
HGB URINE DIPSTICK: NEGATIVE
HYALINE CAST: NONE SEEN /LPF
Ketones, ur: NEGATIVE
Leukocyte Esterase: NEGATIVE
NITRITES URINE, INITIAL: NEGATIVE
PH: 6.5 (ref 5.0–8.0)
Protein, ur: NEGATIVE
RBC / HPF: NONE SEEN /HPF (ref 0–2)
Specific Gravity, Urine: 1.006 (ref 1.001–1.03)
Squamous Epithelial / HPF: NONE SEEN /HPF (ref <=5)
WBC UA: NONE SEEN /HPF (ref 0–5)

## 2017-04-21 LAB — NO CULTURE INDICATED

## 2017-04-21 MED ORDER — TRIAMCINOLONE ACETONIDE 10 MG/ML IJ SUSP
10.0000 mg | Freq: Once | INTRAMUSCULAR | Status: AC
  Administered 2017-04-21: 10 mg

## 2017-04-21 MED ORDER — DICLOFENAC SODIUM 75 MG PO TBEC: 75.0000 mg | DELAYED_RELEASE_TABLET | Freq: Two times a day (BID) | ORAL | 2 refills | Status: DC

## 2017-04-21 NOTE — Progress Notes (Signed)
Subjective:    Patient ID: Jennifer Dougherty, female   DOB: 56 y.o.   MRN: [id number]   HPI patient states that she's had pain in both her feet but the right once been bother me more and at 2 was and not. It's been going on on and off for a couple years and is worsened over the last few months and is gradually becoming more of an issue for her. Patient is not a smoker and likes to be active    Review of Systems  All other systems reviewed and are negative.       Objective:  Physical Exam  Constitutional: She appears well-developed and well-nourished.  Cardiovascular: Intact distal pulses.   Pulmonary/Chest: Effort normal.  Musculoskeletal: Normal range of motion.  Neurological: She is alert.  Skin: Skin is warm.  Nursing note and vitals reviewed.  neurovascular status found to be intact muscle strength adequate range of motion within normal limits with patient noted to have quite a bit of discomfort around the fourth metatarsal phalangeal joint and proximal to this and mild discomfort on the left. I did not note plantar pain or other indications of inflammatory condition and patient had good digital perfusion     Assessment:    Probability for dorsal tendinitis right over left with possibility of capsular involvement     Plan:   H&P x-rays reviewed and today careful dorsal injection administered right 3 mg Kenalog 5 mg Xylocaine advised on ice therapy and supportive shoes and reappoint to recheck  X-rays indicate there is no indication of stress fracture or advanced arthritis

## 2017-04-21 NOTE — Progress Notes (Signed)
   Subjective:    Patient ID: Jennifer Dougherty, female    DOB: 15-May-1961, 56 y.o.   MRN: [id number]  HPI    Review of Systems  Musculoskeletal: Positive for arthralgias. Negative for joint swelling.  All other systems reviewed and are negative.      Objective:   Physical Exam        Assessment & Plan:

## 2017-04-21 NOTE — Patient Instructions (Addendum)
1. Encounter for gynecological examination with abnormal finding Gyn exam s/p LAVH.  Pap reflex done.  Breasts wnl.  Will do Rt Breast Screening Mammo 04/2017, recent Lt Dx mammo/US neg. - Urinalysis with Culture Reflex: Neg - PAP,TP IMGw/HPV RNA,rflx [id number],52/84  2. H/O total vaginal hysterectomy  3. Bloating R/O Ovarian pathology including cysts/Ovarian Ca. - US Transvaginal Non-OB; Future  4. Flu vaccine need  - Flu Vaccine QUAD 36+ mos IM (Fluarix, Quad PF)  Jennifer Dougherty, it was a pleasure to meet you today!  I will inform you of your results as soon as available.   Health Maintenance for Postmenopausal Women Menopause is a normal process in which your reproductive ability comes to an end. This process happens gradually over a span of months to years, usually between the ages of 50 and 78. Menopause is complete when you have missed 12 consecutive menstrual periods. It is important to talk with your health care provider about some of the most common conditions that affect postmenopausal women, such as heart disease, cancer, and bone loss (osteoporosis). Adopting a healthy lifestyle and getting preventive care can help to promote your health and wellness. Those actions can also lower your chances of developing some of these common conditions. What should I know about menopause? During menopause, you may experience a number of symptoms, such as:  Moderate-to-severe hot flashes.  Night sweats.  Decrease in sex drive.  Mood swings.  Headaches.  Tiredness.  Irritability.  Memory problems.  Insomnia.  Choosing to treat or not to treat menopausal changes is an individual decision that you make with your health care provider. What should I know about hormone replacement therapy and supplements? Hormone therapy products are effective for treating symptoms that are associated with menopause, such as hot flashes and night sweats. Hormone replacement carries certain risks,  especially as you become older. If you are thinking about using estrogen or estrogen with progestin treatments, discuss the benefits and risks with your health care provider. What should I know about heart disease and stroke? Heart disease, heart attack, and stroke become more likely as you age. This may be due, in part, to the hormonal changes that your body experiences during menopause. These can affect how your body processes dietary fats, triglycerides, and cholesterol. Heart attack and stroke are both medical emergencies. There are many things that you can do to help prevent heart disease and stroke:  Have your blood pressure checked at least every 1-2 years. High blood pressure causes heart disease and increases the risk of stroke.  If you are 44-27 years old, ask your health care provider if you should take aspirin to prevent a heart attack or a stroke.  Do not use any tobacco products, including cigarettes, chewing tobacco, or electronic cigarettes. If you need help quitting, ask your health care provider.  It is important to eat a healthy diet and maintain a healthy weight. ? Be sure to include plenty of vegetables, fruits, low-fat dairy products, and lean protein. ? Avoid eating foods that are high in solid fats, added sugars, or salt (sodium).  Get regular exercise. This is one of the most important things that you can do for your health. ? Try to exercise for at least 150 minutes each week. The type of exercise that you do should increase your heart rate and make you sweat. This is known as moderate-intensity exercise. ? Try to do strengthening exercises at least twice each week. Do these in addition to the moderate-intensity exercise.  Know your numbers.Ask your health care provider to check your cholesterol and your blood glucose. Continue to have your blood tested as directed by your health care provider.  What should I know about cancer screening? There are several types of  cancer. Take the following steps to reduce your risk and to catch any cancer development as early as possible. Breast Cancer  Practice breast self-awareness. ? This means understanding how your breasts normally appear and feel. ? It also means doing regular breast self-exams. Let your health care provider know about any changes, no matter how small.  If you are 67 or older, have a clinician do a breast exam (clinical breast exam or CBE) every year. Depending on your age, family history, and medical history, it may be recommended that you also have a yearly breast X-ray (mammogram).  If you have a family history of breast cancer, talk with your health care provider about genetic screening.  If you are at high risk for breast cancer, talk with your health care provider about having an MRI and a mammogram every year.  Breast cancer (BRCA) gene test is recommended for women who have family members with BRCA-related cancers. Results of the assessment will determine the need for genetic counseling and BRCA1 and for BRCA2 testing. BRCA-related cancers include these types: ? Breast. This occurs in males or females. ? Ovarian. ? Tubal. This may also be called fallopian tube cancer. ? Cancer of the abdominal or pelvic lining (peritoneal cancer). ? Prostate. ? Pancreatic.  Cervical, Uterine, and Ovarian Cancer Your health care provider may recommend that you be screened regularly for cancer of the pelvic organs. These include your ovaries, uterus, and vagina. This screening involves a pelvic exam, which includes checking for microscopic changes to the surface of your cervix (Pap test).  For women ages 21-65, health care providers may recommend a pelvic exam and a Pap test every three years. For women ages 57-65, they may recommend the Pap test and pelvic exam, combined with testing for human papilloma virus (HPV), every five years. Some types of HPV increase your risk of cervical cancer. Testing for HPV  may also be done on women of any age who have unclear Pap test results.  Other health care providers may not recommend any screening for nonpregnant women who are considered low risk for pelvic cancer and have no symptoms. Ask your health care provider if a screening pelvic exam is right for you.  If you have had past treatment for cervical cancer or a condition that could lead to cancer, you need Pap tests and screening for cancer for at least 20 years after your treatment. If Pap tests have been discontinued for you, your risk factors (such as having a new sexual partner) need to be reassessed to determine if you should start having screenings again. Some women have medical problems that increase the chance of getting cervical cancer. In these cases, your health care provider may recommend that you have screening and Pap tests more often.  If you have a family history of uterine cancer or ovarian cancer, talk with your health care provider about genetic screening.  If you have vaginal bleeding after reaching menopause, tell your health care provider.  There are currently no reliable tests available to screen for ovarian cancer.  Lung Cancer Lung cancer screening is recommended for adults 22-99 years old who are at high risk for lung cancer because of a history of smoking. A yearly low-dose CT scan of the  lungs is recommended if you:  Currently smoke.  Have a history of at least 30 pack-years of smoking and you currently smoke or have quit within the past 15 years. A pack-year is smoking an average of one pack of cigarettes per day for one year.  Yearly screening should:  Continue until it has been 15 years since you quit.  Stop if you develop a health problem that would prevent you from having lung cancer treatment.  Colorectal Cancer  This type of cancer can be detected and can often be prevented.  Routine colorectal cancer screening usually begins at age 25 and continues through age  72.  If you have risk factors for colon cancer, your health care provider may recommend that you be screened at an earlier age.  If you have a family history of colorectal cancer, talk with your health care provider about genetic screening.  Your health care provider may also recommend using home test kits to check for hidden blood in your stool.  A small camera at the end of a tube can be used to examine your colon directly (sigmoidoscopy or colonoscopy). This is done to check for the earliest forms of colorectal cancer.  Direct examination of the colon should be repeated every 5-10 years until age 30. However, if early forms of precancerous polyps or small growths are found or if you have a family history or genetic risk for colorectal cancer, you may need to be screened more often.  Skin Cancer  Check your skin from head to toe regularly.  Monitor any moles. Be sure to tell your health care provider: ? About any new moles or changes in moles, especially if there is a change in a mole's shape or color. ? If you have a mole that is larger than the size of a pencil eraser.  If any of your family members has a history of skin cancer, especially at a young age, talk with your health care provider about genetic screening.  Always use sunscreen. Apply sunscreen liberally and repeatedly throughout the day.  Whenever you are outside, protect yourself by wearing long sleeves, pants, a wide-brimmed hat, and sunglasses.  What should I know about osteoporosis? Osteoporosis is a condition in which bone destruction happens more quickly than new bone creation. After menopause, you may be at an increased risk for osteoporosis. To help prevent osteoporosis or the bone fractures that can happen because of osteoporosis, the following is recommended:  If you are 109-64 years old, get at least 1,000 mg of calcium and at least 600 mg of vitamin D per day.  If you are older than age 41 but younger than age  32, get at least 1,200 mg of calcium and at least 600 mg of vitamin D per day.  If you are older than age 60, get at least 1,200 mg of calcium and at least 800 mg of vitamin D per day.  Smoking and excessive alcohol intake increase the risk of osteoporosis. Eat foods that are rich in calcium and vitamin D, and do weight-bearing exercises several times each week as directed by your health care provider. What should I know about how menopause affects my mental health? Depression may occur at any age, but it is more common as you become older. Common symptoms of depression include:  Low or sad mood.  Changes in sleep patterns.  Changes in appetite or eating patterns.  Feeling an overall lack of motivation or enjoyment of activities that you previously  enjoyed.  Frequent crying spells.  Talk with your health care provider if you think that you are experiencing depression. What should I know about immunizations? It is important that you get and maintain your immunizations. These include:  Tetanus, diphtheria, and pertussis (Tdap) booster vaccine.  Influenza every year before the flu season begins.  Pneumonia vaccine.  Shingles vaccine.  Your health care provider may also recommend other immunizations. This information is not intended to replace advice given to you by your health care provider. Make sure you discuss any questions you have with your health care provider. Document Released: 09/03/2005 Document Revised: 01/30/2016 Document Reviewed: 04/15/2015 Elsevier Interactive Patient Education  2018 [employer].

## 2017-04-22 LAB — PAP, TP IMAGING W/ HPV RNA, RFLX HPV TYPE 16,18/45: HPV DNA High Risk: NOT DETECTED

## 2017-05-04 ENCOUNTER — Telehealth: Payer: Self-pay | Admitting: *Deleted

## 2017-05-04 NOTE — Telephone Encounter (Signed)
Pt called states she is taking diclofenac and wanted to know if she needed to continue until seen by Dr. Paulla Dolly on 08/18/2016. I told pt to continue the diclofenac until seen in office, tendonitis often takes a long time to improve due to bloodflow being less than for bone and muscle. Pt states understanding.

## 2017-05-11 ENCOUNTER — Ambulatory Visit (INDEPENDENT_AMBULATORY_CARE_PROVIDER_SITE_OTHER): Payer: 59 | Admitting: Obstetrics & Gynecology

## 2017-05-11 ENCOUNTER — Ambulatory Visit (INDEPENDENT_AMBULATORY_CARE_PROVIDER_SITE_OTHER): Payer: 59

## 2017-05-11 VITALS — BP 126/80

## 2017-05-11 DIAGNOSIS — R140 Abdominal distension (gaseous): Secondary | ICD-10-CM

## 2017-05-11 DIAGNOSIS — N951 Menopausal and female climacteric states: Secondary | ICD-10-CM

## 2017-05-11 NOTE — Progress Notes (Signed)
    Jennifer Dougherty 1961-06-16 [id number]        55 y.o.  G2P2   RP:  Feeling bloated with h/o Ovarian Cysts for Pelvic US  HPI:  S/P Hysterectomy.  Last pelvic US 03/2015:  Absent uterus. Right ovary 3 simple echo-free avascular follicles 12 x 15 mm, 22 x 22 x 19 mm, 17 x 16 mm. Left ovarian follicle measuring 9 mm. Thick wall corpus luteum follicle measured 32mm negative color flow. Previous cyst from January 2016 not seen. No fluid in the cul-de-sac.  Ca125 from 03/2014 at 4 wnl.  Past medical history,surgical history, problem list, medications, allergies, family history and social history were all reviewed and documented in the EPIC chart.  Directed ROS with pertinent positives and negatives documented in the history of present illness/assessment and plan.  Exam:  Vitals:   05/11/17 1457  BP: 126/80   General appearance:  Normal  Pelvic US today: T/V images.  S/P Hysterectomy.  Vaginal cuff appears normal.  Right Ovary atrophic, appears normal. Left Ovary with 2 small echo-free, thin walled cysts at 11 x 9 mm and 10 x 6 mm.  No FF in CDS.  Assessment/Plan:  56 y.o. G2P2   1. Bloating Probably associated with intestinal gas.  Will pay attention to associated with nutrition.  See Gastro-Enterologist if symptoms worsen.  Pelvic US reassuring.  Findings discussed with patient.  2. Perimenopause May be having ups and downs in ovarian function/hormone levels.  Verify FSH today. [facility]  Counseling on above issues >50% x 15 minutes.  Princess Bruins MD, 3:37 PM 05/11/2017

## 2017-05-12 LAB — FOLLICLE STIMULATING HORMONE: FSH: 56.3 m[IU]/mL

## 2017-05-14 NOTE — Patient Instructions (Signed)
1. Bloating Probably associated with intestinal gas.  Will pay attention to associated with nutrition.  See Gastro-Enterologist if symptoms worsen.  Pelvic US reassuring.  Findings discussed with patient.  2. Perimenopause May be having ups and downs in ovarian function/hormone levels.  Verify FSH today. - [facility]  Jennifer Dougherty, it was good to see you today!

## 2017-05-18 ENCOUNTER — Encounter: Payer: Self-pay | Admitting: Podiatry

## 2017-05-18 ENCOUNTER — Ambulatory Visit (INDEPENDENT_AMBULATORY_CARE_PROVIDER_SITE_OTHER): Payer: 59 | Admitting: Podiatry

## 2017-05-18 DIAGNOSIS — G5761 Lesion of plantar nerve, right lower limb: Secondary | ICD-10-CM | POA: Diagnosis not present

## 2017-05-18 DIAGNOSIS — M7750 Other enthesopathy of unspecified foot: Secondary | ICD-10-CM | POA: Diagnosis not present

## 2017-05-18 DIAGNOSIS — M779 Enthesopathy, unspecified: Secondary | ICD-10-CM

## 2017-05-19 ENCOUNTER — Telehealth: Payer: Self-pay | Admitting: Podiatry

## 2017-05-19 NOTE — Telephone Encounter (Signed)
Called pt regarding insurance coverage for orthotics, per Joy at [facility] pt responsible for 10% co insurance/ no deductible on plan. Pt said to go ahead with order.

## 2017-05-20 NOTE — Progress Notes (Signed)
Subjective:    Patient ID: Jennifer Dougherty, female   DOB: 56 y.o.   MRN: [id number]   HPI patient states that the right foot is still giving her problems and making it hard to walk comfortably. It is improved over where it was previously    ROS      Objective:  Physical Exam neurovascular status intact with inflammation around the lesser MPJs right over left with the possibility for neuroma-like symptomatology right with a relative positive Biagio Borg sign     Assessment:    Chronic inflammation that has improved but present along with inflammatory changes and possible neuroma symptoms     Plan:    H&P conditions reviewed and at this point I recommended orthotics to try to disperse weight. Patient wants orthotics and is scanned for customized orthotics at the current time and will be seen back when ready to reduce stress against the metatarsals

## 2017-06-13 ENCOUNTER — Ambulatory Visit: Payer: 59 | Admitting: Orthotics

## 2017-06-13 DIAGNOSIS — G5761 Lesion of plantar nerve, right lower limb: Secondary | ICD-10-CM

## 2017-06-13 NOTE — Progress Notes (Signed)
Patient came in today to pick up custom made foot orthotics.  The goals were accomplished and the patient reported no dissatisfaction with said orthotics.  Patient was advised of breakin period and how to report any issues. 

## 2017-06-14 ENCOUNTER — Telehealth: Payer: Self-pay | Admitting: Orthotics

## 2017-06-14 ENCOUNTER — Telehealth: Payer: Self-pay | Admitting: Podiatry

## 2017-06-14 DIAGNOSIS — Z85820 Personal history of malignant melanoma of skin: Secondary | ICD-10-CM | POA: Diagnosis not present

## 2017-06-14 DIAGNOSIS — D485 Neoplasm of uncertain behavior of skin: Secondary | ICD-10-CM | POA: Diagnosis not present

## 2017-06-14 DIAGNOSIS — D1800 Hemangioma unspecified site: Secondary | ICD-10-CM | POA: Diagnosis not present

## 2017-06-14 DIAGNOSIS — L821 Other seborrheic keratosis: Secondary | ICD-10-CM | POA: Diagnosis not present

## 2017-06-14 NOTE — Telephone Encounter (Signed)
Talked with patient about f/o.Jennifer KitchenMarland KitchenF/O didn't come in with a met pad and after wearing them she felt they were too narrow.  Richy to refab 1/16 wider with taped down met and neuroma pads, top cover will be unglued so we can find optimal comfort and function.

## 2017-06-14 NOTE — Telephone Encounter (Signed)
Pt left voicemail for me or Liliane Channel to call about orthotics not working.  I returned call and pt was very unhappy with the orthotics she got yesterday. She said she was expecting something with a met pad since that is where she is having the problem and she said it was discussed at the first appt. She also said they are too narrow.  I tried to make her an appt to see Liliane Channel but she said Liliane Channel said he could get them remade so why did she need to come in.   I told pt I would have Liliane Channel call her.

## 2017-06-29 ENCOUNTER — Other Ambulatory Visit: Payer: 59 | Admitting: Orthotics

## 2017-06-30 DIAGNOSIS — D0361 Melanoma in situ of right upper limb, including shoulder: Secondary | ICD-10-CM | POA: Diagnosis not present

## 2017-07-06 ENCOUNTER — Other Ambulatory Visit: Payer: 59 | Admitting: Orthotics

## 2017-08-18 DIAGNOSIS — J029 Acute pharyngitis, unspecified: Secondary | ICD-10-CM | POA: Diagnosis not present

## 2017-08-18 DIAGNOSIS — R030 Elevated blood-pressure reading, without diagnosis of hypertension: Secondary | ICD-10-CM | POA: Diagnosis not present

## 2017-08-18 DIAGNOSIS — J329 Chronic sinusitis, unspecified: Secondary | ICD-10-CM | POA: Diagnosis not present

## 2017-09-01 ENCOUNTER — Ambulatory Visit: Payer: 59 | Admitting: Orthotics

## 2017-09-01 DIAGNOSIS — G5761 Lesion of plantar nerve, right lower limb: Secondary | ICD-10-CM

## 2017-09-01 NOTE — Progress Notes (Signed)
Patient arrived to p/up adjusted f/o (neurma pad and met pad).  Left top cover unglued so she can attest to best placement of met pad and/or neuroma pad.  She seemed immediately pleased with the result.  She will return in two weeks to have top cover glued on.

## 2017-09-27 ENCOUNTER — Other Ambulatory Visit: Payer: Self-pay | Admitting: Obstetrics & Gynecology

## 2017-09-27 DIAGNOSIS — Z1231 Encounter for screening mammogram for malignant neoplasm of breast: Secondary | ICD-10-CM

## 2017-10-04 ENCOUNTER — Ambulatory Visit
Admission: RE | Admit: 2017-10-04 | Discharge: 2017-10-04 | Disposition: A | Discharge status: Home / Self Care | Payer: 59 | Source: Ambulatory Visit | Attending: Obstetrics & Gynecology | Admitting: Obstetrics & Gynecology

## 2017-10-04 DIAGNOSIS — Z1231 Encounter for screening mammogram for malignant neoplasm of breast: Secondary | ICD-10-CM

## 2017-10-06 ENCOUNTER — Other Ambulatory Visit: Payer: Self-pay | Admitting: Obstetrics & Gynecology

## 2017-10-06 DIAGNOSIS — R928 Other abnormal and inconclusive findings on diagnostic imaging of breast: Secondary | ICD-10-CM

## 2017-10-11 ENCOUNTER — Ambulatory Visit
Admission: RE | Admit: 2017-10-11 | Discharge: 2017-10-11 | Disposition: A | Discharge status: Home / Self Care | Payer: 59 | Source: Ambulatory Visit | Attending: Obstetrics & Gynecology | Admitting: Obstetrics & Gynecology

## 2017-10-11 DIAGNOSIS — R928 Other abnormal and inconclusive findings on diagnostic imaging of breast: Secondary | ICD-10-CM

## 2017-10-11 DIAGNOSIS — N6002 Solitary cyst of left breast: Secondary | ICD-10-CM | POA: Diagnosis not present

## 2017-10-14 ENCOUNTER — Ambulatory Visit: Payer: 59

## 2017-10-21 DIAGNOSIS — H2513 Age-related nuclear cataract, bilateral: Secondary | ICD-10-CM | POA: Diagnosis not present

## 2017-10-21 DIAGNOSIS — H40013 Open angle with borderline findings, low risk, bilateral: Secondary | ICD-10-CM | POA: Diagnosis not present

## 2017-10-21 DIAGNOSIS — H04123 Dry eye syndrome of bilateral lacrimal glands: Secondary | ICD-10-CM | POA: Diagnosis not present

## 2017-12-07 DIAGNOSIS — L821 Other seborrheic keratosis: Secondary | ICD-10-CM | POA: Diagnosis not present

## 2017-12-07 DIAGNOSIS — D225 Melanocytic nevi of trunk: Secondary | ICD-10-CM | POA: Diagnosis not present

## 2017-12-07 DIAGNOSIS — Z85820 Personal history of malignant melanoma of skin: Secondary | ICD-10-CM | POA: Diagnosis not present

## 2017-12-07 DIAGNOSIS — D1800 Hemangioma unspecified site: Secondary | ICD-10-CM | POA: Diagnosis not present

## 2017-12-14 DIAGNOSIS — R030 Elevated blood-pressure reading, without diagnosis of hypertension: Secondary | ICD-10-CM | POA: Diagnosis not present

## 2017-12-14 DIAGNOSIS — R011 Cardiac murmur, unspecified: Secondary | ICD-10-CM | POA: Diagnosis not present

## 2017-12-14 DIAGNOSIS — K219 Gastro-esophageal reflux disease without esophagitis: Secondary | ICD-10-CM | POA: Diagnosis not present

## 2018-01-11 ENCOUNTER — Ambulatory Visit: Payer: 59 | Admitting: Cardiology

## 2018-02-23 ENCOUNTER — Telehealth: Payer: Self-pay

## 2018-02-23 NOTE — Telephone Encounter (Signed)
Scheduled CE with you for 06/08/18. She would like to come on 11/11.19 and have her labs done ahead of visit.   If ok, what to order?

## 2018-02-23 NOTE — Telephone Encounter (Signed)
Yes, agree with CBC, CMP, TSH, Lipid panel, Vit D.

## 2018-02-27 ENCOUNTER — Other Ambulatory Visit: Payer: Self-pay | Admitting: Obstetrics & Gynecology

## 2018-02-27 DIAGNOSIS — Z01419 Encounter for gynecological examination (general) (routine) without abnormal findings: Secondary | ICD-10-CM

## 2018-02-27 DIAGNOSIS — Z13220 Encounter for screening for lipoid disorders: Secondary | ICD-10-CM

## 2018-02-27 DIAGNOSIS — Z1329 Encounter for screening for other suspected endocrine disorder: Secondary | ICD-10-CM

## 2018-02-27 DIAGNOSIS — Z1321 Encounter for screening for nutritional disorder: Secondary | ICD-10-CM

## 2018-02-27 NOTE — Telephone Encounter (Signed)
Spoke with patient and informed her Dr. Marguerita Merles is fine with this. Orders placed and lab appt scheduled.

## 2018-03-10 DIAGNOSIS — Z23 Encounter for immunization: Secondary | ICD-10-CM | POA: Diagnosis not present

## 2018-03-16 DIAGNOSIS — H04123 Dry eye syndrome of bilateral lacrimal glands: Secondary | ICD-10-CM | POA: Diagnosis not present

## 2018-03-16 DIAGNOSIS — H2513 Age-related nuclear cataract, bilateral: Secondary | ICD-10-CM | POA: Diagnosis not present

## 2018-03-16 DIAGNOSIS — H40013 Open angle with borderline findings, low risk, bilateral: Secondary | ICD-10-CM | POA: Diagnosis not present

## 2018-04-11 DIAGNOSIS — Z23 Encounter for immunization: Secondary | ICD-10-CM | POA: Diagnosis not present

## 2018-04-27 DIAGNOSIS — H43812 Vitreous degeneration, left eye: Secondary | ICD-10-CM | POA: Diagnosis not present

## 2018-04-27 DIAGNOSIS — H04123 Dry eye syndrome of bilateral lacrimal glands: Secondary | ICD-10-CM | POA: Diagnosis not present

## 2018-04-27 DIAGNOSIS — H2513 Age-related nuclear cataract, bilateral: Secondary | ICD-10-CM | POA: Diagnosis not present

## 2018-05-09 DIAGNOSIS — K219 Gastro-esophageal reflux disease without esophagitis: Secondary | ICD-10-CM | POA: Insufficient documentation

## 2018-05-09 DIAGNOSIS — R011 Cardiac murmur, unspecified: Secondary | ICD-10-CM | POA: Insufficient documentation

## 2018-05-15 DIAGNOSIS — H35422 Microcystoid degeneration of retina, left eye: Secondary | ICD-10-CM | POA: Diagnosis not present

## 2018-05-15 DIAGNOSIS — E119 Type 2 diabetes mellitus without complications: Secondary | ICD-10-CM | POA: Diagnosis not present

## 2018-05-15 DIAGNOSIS — H43813 Vitreous degeneration, bilateral: Secondary | ICD-10-CM | POA: Diagnosis not present

## 2018-05-16 ENCOUNTER — Encounter: Payer: Self-pay | Admitting: Cardiology

## 2018-05-16 ENCOUNTER — Ambulatory Visit: Payer: 59 | Admitting: Cardiology

## 2018-05-16 VITALS — BP 118/66 | HR 83 | Ht 65.5 in | Wt 163.0 lb

## 2018-05-16 DIAGNOSIS — R011 Cardiac murmur, unspecified: Secondary | ICD-10-CM | POA: Diagnosis not present

## 2018-05-16 NOTE — Progress Notes (Signed)
Cardiology Consultation:    Date:  05/16/2018   ID:  Jennifer Dougherty, DOB [birth year], MRN [id number]  PCP:  Jennifer Pepper, MD  Cardiologist:  Jenne Campus, MD   Referring MD: Jennifer Pepper, MD   Chief Complaint  Patient presents with  . Heart Murmur  Have a heart murmur  History of Present Illness:    Jennifer Dougherty is a 57 y.o. female who is being seen today for the evaluation of heart murmur at the request of Jennifer Pepper, MD.  Years ago she was told to have heart murmur no tests were done after that.  However recently she went to her primary care physician and again heart murmur was discovered she is become more and she would like to be evaluated for it.  Overall she is doing very well no chest pain tightness squeezing pressure burning chest.  Rare palpitations she described a situation when she wakes up in the middle of the night with some gasping for air.  She gets sensation that she is knuckle for she snores according to her husband.  I suspect she may have sleep apnea.  No paroxysmal nocturnal dyspnea no swelling of lower extremities.  Past Medical History:  Diagnosis Date  . Cancer ([place])    melanoma removed bilateral upper arm   . Hx gestational diabetes   . Normal spontaneous vaginal delivery    times 2    Past Surgical History:  Procedure Laterality Date  . LAPAROSCOPIC VAGINAL HYSTERECTOMY  01/2006  . MELANOMA EXCISION  07/2016   left arm  . VAGINAL HYSTERECTOMY     [facility]    Current Medications: Current Meds  Medication Sig  . Calcium Carbonate-Vit D-Min (CALTRATE PLUS PO) Take 1 tablet by mouth daily.   . cholecalciferol (VITAMIN D) 1000 UNITS tablet Take 1,000 Units by mouth daily.  . Omega-3 Fatty Acids (FISH OIL) 1200 MG CAPS Take 2 capsules by mouth daily.   Marland Kitchen omeprazole (PRILOSEC) 20 MG capsule Take 20 mg by mouth as needed.  . vitamin E 400 UNIT capsule Take 400 Units by mouth daily.     Allergies:   Patient has no known allergies.    Social History   Socioeconomic History  . Marital status: Married    Spouse name: Not on file  . Number of children: Not on file  . Years of education: Not on file  . Highest education level: Not on file  Occupational History  . Not on file  Social Needs  . Financial resource strain: Not on file  . Food insecurity:    Worry: Not on file    Inability: Not on file  . Transportation needs:    Medical: Not on file    Non-medical: Not on file  Tobacco Use  . Smoking status: Never Smoker  . Smokeless tobacco: Never Used  Substance and Sexual Activity  . Alcohol use: Yes    Alcohol/week: 0.0 standard drinks    Comment: OCCASIONALLY  . Drug use: No  . Sexual activity: Yes    Birth control/protection: Surgical  Lifestyle  . Physical activity:    Days per week: Not on file    Minutes per session: Not on file  . Stress: Not on file  Relationships  . Social connections:    Talks on phone: Not on file    Gets together: Not on file    Attends religious service: Not on file    Active member of club or organization: Not on  file    Attends meetings of clubs or organizations: Not on file    Relationship status: Not on file  Other Topics Concern  . Not on file  Social History Narrative  . Not on file     Family History: The patient's family history includes Colon cancer in her maternal grandmother and mother; Hypertension in her father and mother. There is no history of Breast cancer. ROS:   Please see the history of present illness.    All 14 point review of systems negative except as described per history of present illness.  EKGs/Labs/Other Studies Reviewed:    The following studies were reviewed today:   EKG:  EKG is  ordered today.  The ekg ordered today demonstrates normal sinus rhythm possible left atrial enlargement no ST segment changes  Recent Labs: No results found for requested labs within last 8760 hours.  Recent Lipid Panel    Component Value Date/Time    CHOL 231 (H) 04/13/2017 0829   TRIG 62 04/13/2017 0829   HDL 104 04/13/2017 0829   CHOLHDL 2.2 04/13/2017 0829   VLDL 10 03/25/2016 0827   LDLCALC 113 (H) 04/13/2017 0829    Physical Exam:    VS:  BP 118/66   Pulse 83   Ht 5' 5.5" (1.664 m)   Wt 163 lb (73.9 kg)   SpO2 97%   BMI 26.71 kg/m     Wt Readings from Last 3 Encounters:  05/16/18 163 lb (73.9 kg)  04/20/17 158 lb 14.4 oz (72.1 kg)  11/04/16 146 lb (66.2 kg)     GEN:  Well nourished, well developed in no acute distress HEENT: Normal NECK: No JVD; No carotid bruits LYMPHATICS: No lymphadenopathy CARDIAC: RRR, soft systolic murmur grade 1/6 to 2/6 best heard at the right upper portion of the sternum with some characteristic of holosystolic murmur., no rubs, no gallops RESPIRATORY:  Clear to auscultation without rales, wheezing or rhonchi  ABDOMEN: Soft, non-tender, non-distended MUSCULOSKELETAL:  No edema; No deformity  SKIN: Warm and dry NEUROLOGIC:  Alert and oriented x 3 PSYCHIATRIC:  Normal affect   ASSESSMENT:    1. Heart murmur    PLAN:    In order of problems listed above:  1. Heart murmur quite unusual rather soft want to do a scale up to 6 best heard at the right upper portion of the sternum.  Does have some holosystolic characteristic.  Overall luckily hemodynamically she stable she does not have any symptoms of it she required echocardiogram to clarify the nature of the murmur. 2. Gastroesophageal reflux disease.  She takes some omeprazole for it with good relief. 3. I did review her cholesterol which is quite amazing.  Her HDL is 104 her LDL of 113.   Medication Adjustments/Labs and Tests Ordered: Current medicines are reviewed at length with the patient today.  Concerns regarding medicines are outlined above.  No orders of the defined types were placed in this encounter.  No orders of the defined types were placed in this encounter.   Signed, Park Liter, MD, [facility]. 05/16/2018  4:29 PM    [place]

## 2018-05-16 NOTE — Patient Instructions (Signed)
Medication Instructions:  Your physician recommends that you continue on your current medications as directed. Please refer to the Current Medication list given to you today.  If you need a refill on your cardiac medications before your next appointment, please call your pharmacy.   Lab work: None.  If you have labs (blood work) drawn today and your tests are completely normal, you will receive your results only by: . MyChart Message (if you have MyChart) OR . A paper copy in the mail If you have any lab test that is abnormal or we need to change your treatment, we will call you to review the results.  Testing/Procedures: Your physician has requested that you have an echocardiogram. Echocardiography is a painless test that uses sound waves to create images of your heart. It provides your doctor with information about the size and shape of your heart and how well your heart's chambers and valves are working. This procedure takes approximately one hour. There are no restrictions for this procedure.    Follow-Up: At [facility], you and your health needs are our priority.  As part of our continuing mission to provide you with exceptional heart care, we have created designated Provider Care Teams.  These Care Teams include your primary Cardiologist (physician) and Advanced Practice Providers (APPs -  Physician Assistants and Nurse Practitioners) who all work together to provide you with the care you need, when you need it. You will need a follow up appointment in 1 months.  Please call our office 2 months in advance to schedule this appointment.  You may see No primary care provider on file. or another member of our [facility] Provider Team in [place]: Brian Munley, MD . Rajan Revankar, MD  Any Other Special Instructions Will Be Listed Below (If Applicable).  Echocardiogram An echocardiogram, or echocardiography, uses sound waves (ultrasound) to produce an image of your heart. The  echocardiogram is simple, painless, obtained within a short period of time, and offers valuable information to your health care provider. The images from an echocardiogram can provide information such as:  Evidence of coronary artery disease (CAD).  Heart size.  Heart muscle function.  Heart valve function.  Aneurysm detection.  Evidence of a past heart attack.  Fluid buildup around the heart.  Heart muscle thickening.  Assess heart valve function.  Tell a health care provider about:  Any allergies you have.  All medicines you are taking, including vitamins, herbs, eye drops, creams, and over-the-counter medicines.  Any problems you or family members have had with anesthetic medicines.  Any blood disorders you have.  Any surgeries you have had.  Any medical conditions you have.  Whether you are pregnant or may be pregnant. What happens before the procedure? No special preparation is needed. Eat and drink normally. What happens during the procedure?  In order to produce an image of your heart, gel will be applied to your chest and a wand-like tool (transducer) will be moved over your chest. The gel will help transmit the sound waves from the transducer. The sound waves will harmlessly bounce off your heart to allow the heart images to be captured in real-time motion. These images will then be recorded.  You may need an IV to receive a medicine that improves the quality of the pictures. What happens after the procedure? You may return to your normal schedule including diet, activities, and medicines, unless your health care provider tells you otherwise. This information is not intended to replace   advice given to you by your health care provider. Make sure you discuss any questions you have with your health care provider. Document Released: 07/09/2000 Document Revised: 02/28/2016 Document Reviewed: 03/19/2013 Elsevier Interactive Patient Education  2017 Elsevier  Inc.    

## 2018-05-18 ENCOUNTER — Ambulatory Visit (HOSPITAL_BASED_OUTPATIENT_CLINIC_OR_DEPARTMENT_OTHER)
Admission: RE | Admit: 2018-05-18 | Discharge: 2018-05-18 | Disposition: A | Discharge status: Home / Self Care | Payer: 59 | Source: Ambulatory Visit | Attending: Cardiology | Admitting: Cardiology

## 2018-05-18 DIAGNOSIS — I351 Nonrheumatic aortic (valve) insufficiency: Secondary | ICD-10-CM | POA: Insufficient documentation

## 2018-05-18 DIAGNOSIS — D1779 Benign lipomatous neoplasm of other sites: Secondary | ICD-10-CM | POA: Diagnosis not present

## 2018-05-18 DIAGNOSIS — R011 Cardiac murmur, unspecified: Secondary | ICD-10-CM | POA: Diagnosis present

## 2018-05-18 NOTE — Progress Notes (Signed)
  Echocardiogram 2D Echocardiogram has been performed.  Jennifer Dougherty T Jennifer Dougherty 05/18/2018, 2:39 PM

## 2018-05-29 DIAGNOSIS — K5730 Diverticulosis of large intestine without perforation or abscess without bleeding: Secondary | ICD-10-CM | POA: Diagnosis not present

## 2018-05-29 DIAGNOSIS — Z800 Family history of malignant neoplasm of digestive organs: Secondary | ICD-10-CM | POA: Diagnosis not present

## 2018-05-30 DIAGNOSIS — Z23 Encounter for immunization: Secondary | ICD-10-CM | POA: Diagnosis not present

## 2018-06-05 ENCOUNTER — Other Ambulatory Visit: Payer: 59

## 2018-06-05 DIAGNOSIS — Z01419 Encounter for gynecological examination (general) (routine) without abnormal findings: Secondary | ICD-10-CM | POA: Diagnosis not present

## 2018-06-05 DIAGNOSIS — Z13220 Encounter for screening for lipoid disorders: Secondary | ICD-10-CM

## 2018-06-05 DIAGNOSIS — Z1329 Encounter for screening for other suspected endocrine disorder: Secondary | ICD-10-CM | POA: Diagnosis not present

## 2018-06-05 DIAGNOSIS — Z1321 Encounter for screening for nutritional disorder: Secondary | ICD-10-CM

## 2018-06-06 LAB — COMPREHENSIVE METABOLIC PANEL
AG Ratio: 1.5 (calc) (ref 1.0–2.5)
ALBUMIN MSPROF: 4.2 g/dL (ref 3.6–5.1)
ALKALINE PHOSPHATASE (APISO): 51 U/L (ref 33–130)
ALT: 10 U/L (ref 6–29)
AST: 15 U/L (ref 10–35)
BUN: 12 mg/dL (ref 7–25)
CO2: 31 mmol/L (ref 20–32)
CREATININE: 0.64 mg/dL (ref 0.50–1.05)
Calcium: 9.4 mg/dL (ref 8.6–10.4)
Chloride: 101 mmol/L (ref 98–110)
Globulin: 2.8 g/dL (calc) (ref 1.9–3.7)
Glucose, Bld: 84 mg/dL (ref 65–99)
POTASSIUM: 4.5 mmol/L (ref 3.5–5.3)
Sodium: 139 mmol/L (ref 135–146)
Total Bilirubin: 0.5 mg/dL (ref 0.2–1.2)
Total Protein: 7 g/dL (ref 6.1–8.1)

## 2018-06-06 LAB — LIPID PANEL
Cholesterol: 217 mg/dL — ABNORMAL HIGH (ref <200)
HDL: 75 mg/dL (ref >50)
LDL Cholesterol (Calc): 124 mg/dL (calc) — ABNORMAL HIGH
NON-HDL CHOLESTEROL (CALC): 142 mg/dL — AB (ref <130)
Total CHOL/HDL Ratio: 2.9 (calc) (ref <5.0)
Triglycerides: 82 mg/dL (ref <150)

## 2018-06-06 LAB — CBC WITH DIFFERENTIAL/PLATELET
BASOS ABS: 48 {cells}/uL (ref 0–200)
Basophils Relative: 0.7 %
EOS ABS: 173 {cells}/uL (ref 15–500)
Eosinophils Relative: 2.5 %
HCT: 38.3 % (ref 35.0–45.0)
HEMOGLOBIN: 12.6 g/dL (ref 11.7–15.5)
Lymphs Abs: 2505 cells/uL (ref 850–3900)
MCH: 28.6 pg (ref 27.0–33.0)
MCHC: 32.9 g/dL (ref 32.0–36.0)
MCV: 87 fL (ref 80.0–100.0)
MONOS PCT: 10.1 %
MPV: 11.4 fL (ref 7.5–12.5)
NEUTROS PCT: 50.4 %
Neutro Abs: 3478 cells/uL (ref 1500–7800)
PLATELETS: 314 10*3/uL (ref 140–400)
RBC: 4.4 10*6/uL (ref 3.80–5.10)
RDW: 12.7 % (ref 11.0–15.0)
Total Lymphocyte: 36.3 %
WBC mixed population: 697 cells/uL (ref 200–950)
WBC: 6.9 10*3/uL (ref 3.8–10.8)

## 2018-06-06 LAB — THYROID PANEL WITH TSH
FREE THYROXINE INDEX: 1.9 (ref 1.4–3.8)
T3 Uptake: 31 % (ref 22–35)
T4, Total: 6 ug/dL (ref 5.1–11.9)
TSH: 1.46 m[IU]/L

## 2018-06-06 LAB — VITAMIN D 25 HYDROXY (VIT D DEFICIENCY, FRACTURES): Vit D, 25-Hydroxy: 37 ng/mL (ref 30–100)

## 2018-06-08 ENCOUNTER — Ambulatory Visit (INDEPENDENT_AMBULATORY_CARE_PROVIDER_SITE_OTHER): Payer: 59 | Admitting: Obstetrics & Gynecology

## 2018-06-08 ENCOUNTER — Encounter: Payer: Self-pay | Admitting: Obstetrics & Gynecology

## 2018-06-08 VITALS — BP 126/78 | Ht 65.0 in | Wt 161.0 lb

## 2018-06-08 DIAGNOSIS — R339 Retention of urine, unspecified: Secondary | ICD-10-CM | POA: Diagnosis not present

## 2018-06-08 DIAGNOSIS — B373 Candidiasis of vulva and vagina: Secondary | ICD-10-CM | POA: Diagnosis not present

## 2018-06-08 DIAGNOSIS — Z780 Asymptomatic menopausal state: Secondary | ICD-10-CM | POA: Diagnosis not present

## 2018-06-08 DIAGNOSIS — Z90710 Acquired absence of both cervix and uterus: Secondary | ICD-10-CM | POA: Diagnosis not present

## 2018-06-08 DIAGNOSIS — B3731 Acute candidiasis of vulva and vagina: Secondary | ICD-10-CM

## 2018-06-08 DIAGNOSIS — Z01419 Encounter for gynecological examination (general) (routine) without abnormal findings: Secondary | ICD-10-CM | POA: Diagnosis not present

## 2018-06-08 MED ORDER — FLUCONAZOLE 150 MG PO TABS: 150.0000 mg | ORAL_TABLET | Freq: Once | ORAL | 2 refills | Status: AC

## 2018-06-08 NOTE — Progress Notes (Signed)
Jennifer Dougherty 06-03-1961 [id number]   History:    57 y.o. G2P2L2 Married  RP:  Established patient presenting for annual gyn exam   HPI: S/P LAVH.  Menopause, well on no HRT.  No pelvic pain.  No pain with IC.  Mild white vaginal secretions with no itching or odor.  Urination within normal limits, but sometimes difficulties at completely emptying the bladder.  No stress urinary incontinence or urgency.  Bowel movements normal.  Breasts normal.  Body mass index 26.79.  Needs to exercise more regularly.  Seen by cardiologist and echo of the heart done within normal limits.  Recent health labs within normal limits.  Colonoscopy done in 2019, will obtain report.  Past medical history,surgical history, family history and social history were all reviewed and documented in the EPIC chart.  Gynecologic History No LMP recorded. Patient has had a hysterectomy. Contraception: status post hysterectomy Last Pap: 03/2017. Results were: Negative/HPV HR neg Last mammogram: 09/2017. Results were: Rt negative, Lt Dx mammo/US benign. Bone Density: Never Colonoscopy: 2019, will obtain report  Obstetric History OB History  Gravida Para Term Preterm AB Living  2 2       2   SAB TAB Ectopic Multiple Live Births               # Outcome Date GA Lbr Len/2nd Weight Sex Delivery Anes PTL Lv  2 Para           1 Para              ROS: A ROS was performed and pertinent positives and negatives are included in the history.  GENERAL: No fevers or chills. HEENT: No change in vision, no earache, sore throat or sinus congestion. NECK: No pain or stiffness. CARDIOVASCULAR: No chest pain or pressure. No palpitations. PULMONARY: No shortness of breath, cough or wheeze. GASTROINTESTINAL: No abdominal pain, nausea, vomiting or diarrhea, melena or bright red blood per rectum. GENITOURINARY: No urinary frequency, urgency, hesitancy or dysuria. MUSCULOSKELETAL: No joint or muscle pain, no back pain, no recent trauma.  DERMATOLOGIC: No rash, no itching, no lesions. ENDOCRINE: No polyuria, polydipsia, no heat or cold intolerance. No recent change in weight. HEMATOLOGICAL: No anemia or easy bruising or bleeding. NEUROLOGIC: No headache, seizures, numbness, tingling or weakness. PSYCHIATRIC: No depression, no loss of interest in normal activity or change in sleep pattern.     Exam:   BP 126/78   Ht 5\' 5"  (1.651 m)   Wt 161 lb (73 kg)   BMI 26.79 kg/m   Body mass index is 26.79 kg/m.  General appearance : Well developed well nourished female. No acute distress HEENT: Eyes: no retinal hemorrhage or exudates,  Neck supple, trachea midline, no carotid bruits, no thyroidmegaly Lungs: Clear to auscultation, no rhonchi or wheezes, or rib retractions  Heart: Regular rate and rhythm, no murmurs or gallops Breast:Examined in sitting and supine position were symmetrical in appearance, no palpable masses or tenderness,  no skin retraction, no nipple inversion, no nipple discharge, no skin discoloration, no axillary or supraclavicular lymphadenopathy Abdomen: no palpable masses or tenderness, no rebound or guarding Extremities: no edema or skin discoloration or tenderness  Pelvic: Vulva: Normal             Vagina: No gross lesions or discharge  Cervix/Uterus absent  Adnexa  Without masses or tenderness  Anus: Normal  U/A: Yellow clear, proteins negative, nitrites negative, white blood cells negative, red blood cells negative, no bacteria.  Completely normal urine analysis, no indication for culture.   Assessment/Plan:  57 y.o. female for annual exam   1. Well female exam with routine gynecological exam Gynecologic exam status post total hysterectomy in menopause.  Pap test in September 2018 was negative with negative high risk HPV.  Breasts normal.  Screening mammogram March 2019 right breast negative and left breast diagnostic mammogram with ultrasound benign.  Health labs within normal limits with family  physician.  Recent colonoscopy 2019, will obtain report.  Cardiac echo within normal limits 2019.  2. S/P total hysterectomy  3. Postmenopausal Well on no hormone replacement therapy.  Vitamin D supplements, calcium intake of 1.5 g/day and regular weightbearing physical activity recommended.  4. Vaginal yeast infection Frequent yeast vaginitis.  No evidence of infection today.  Fluconazole prescription sent to use as needed in the course of the year.    5. Incomplete bladder emptying Urine analysis completely negative.  No indication for culture.  Recommend regular schedule of urination to avoid overfilling of the bladder.  Continue with Kegel exercises every day. - Urinalysis,Complete w/RFL Culture  Other orders - fluconazole (DIFLUCAN) 150 MG tablet; Take 1 tablet (150 mg total) by mouth once for 1 dose.  Princess Bruins MD, 2:08 PM 06/08/2018

## 2018-06-08 NOTE — Patient Instructions (Signed)
1. Well female exam with routine gynecological exam Gynecologic exam status post total hysterectomy in menopause.  Pap test in September 2018 was negative with negative high risk HPV.  Breasts normal.  Screening mammogram March 2019 right breast negative and left breast diagnostic mammogram with ultrasound benign.  Health labs within normal limits with family physician.  Recent colonoscopy 2019, will obtain report.  Cardiac echo within normal limits 2019.  2. S/P total hysterectomy  3. Postmenopausal Well on no hormone replacement therapy.  Vitamin D supplements, calcium intake of 1.5 g/day and regular weightbearing physical activity recommended.  4. Vaginal yeast infection Frequent yeast vaginitis.  No evidence of infection today.  Fluconazole prescription sent to use as needed in the course of the year.    5. Incomplete bladder emptying Urine analysis completely negative.  No indication for culture.  Recommend regular schedule of urination to avoid overfilling of the bladder.  Continue with Kegel exercises every day. - Urinalysis,Complete w/RFL Culture  Other orders - fluconazole (DIFLUCAN) 150 MG tablet; Take 1 tablet (150 mg total) by mouth once for 1 dose.  Aidynn, it was a pleasure seeing you today!

## 2018-06-09 LAB — URINALYSIS, COMPLETE W/RFL CULTURE
BACTERIA UA: NONE SEEN /HPF
Bilirubin Urine: NEGATIVE
Glucose, UA: NEGATIVE
HYALINE CAST: NONE SEEN /LPF
Hgb urine dipstick: NEGATIVE
KETONES UR: NEGATIVE
Leukocyte Esterase: NEGATIVE
Nitrites, Initial: NEGATIVE
Protein, ur: NEGATIVE
RBC / HPF: NONE SEEN /HPF (ref 0–2)
SPECIFIC GRAVITY, URINE: 1.002 (ref 1.001–1.03)
WBC, UA: NONE SEEN /HPF (ref 0–5)
pH: 6 (ref 5.0–8.0)

## 2018-06-09 LAB — NO CULTURE INDICATED

## 2018-06-14 DIAGNOSIS — L821 Other seborrheic keratosis: Secondary | ICD-10-CM | POA: Diagnosis not present

## 2018-06-14 DIAGNOSIS — Z86018 Personal history of other benign neoplasm: Secondary | ICD-10-CM | POA: Diagnosis not present

## 2018-06-14 DIAGNOSIS — Z85820 Personal history of malignant melanoma of skin: Secondary | ICD-10-CM | POA: Diagnosis not present

## 2018-06-19 ENCOUNTER — Ambulatory Visit: Payer: 59 | Admitting: Cardiology

## 2018-06-19 ENCOUNTER — Encounter: Payer: Self-pay | Admitting: Cardiology

## 2018-06-19 VITALS — BP 126/82 | HR 80 | Ht 65.5 in | Wt 159.0 lb

## 2018-06-19 DIAGNOSIS — E785 Hyperlipidemia, unspecified: Secondary | ICD-10-CM

## 2018-06-19 DIAGNOSIS — R079 Chest pain, unspecified: Secondary | ICD-10-CM

## 2018-06-19 DIAGNOSIS — R011 Cardiac murmur, unspecified: Secondary | ICD-10-CM | POA: Diagnosis not present

## 2018-06-19 NOTE — Progress Notes (Signed)
Cardiology Office Note:    Date:  06/19/2018   ID:  Jennifer Dougherty, DOB 02-27-1961, MRN [id number]  PCP:  London Pepper, MD  Cardiologist:  Jenne Campus, MD    Referring MD: London Pepper, MD   Chief Complaint  Patient presents with  . 1 month follow up  Doing well  History of Present Illness:    Jennifer Dougherty is a 57 y.o. female with history of heart murmur she did have echocardiogram likely echocardiogram did not show critical lesion she got mild aortic insufficiency trace TR trace MR normal ejection fraction lipomatous septum.  The purpose of the visit today to discuss results of her echocardiogram as well as to talk about risk factors for coronary artery disease she got quite interesting cholesterol her LDL is slightly elevated but HDL is high more than 70.  She is very concerned about potentially having narrowing of her coronary arteries we talked about what test could be done to best stratify her risk factors and I think calcium score will be beneficial.  Past Medical History:  Diagnosis Date  . Cancer ([place])    melanoma removed bilateral upper arm   . Hx gestational diabetes   . Normal spontaneous vaginal delivery    times 2    Past Surgical History:  Procedure Laterality Date  . LAPAROSCOPIC VAGINAL HYSTERECTOMY  01/2006  . MELANOMA EXCISION  07/2016   left arm  . VAGINAL HYSTERECTOMY     [facility]    Current Medications: Current Meds  Medication Sig  . Calcium Carbonate-Vit D-Min (CALTRATE PLUS PO) Take 1 tablet by mouth daily.   . cholecalciferol (VITAMIN D) 1000 UNITS tablet Take 1,000 Units by mouth daily.  . Omega-3 Fatty Acids (FISH OIL) 1200 MG CAPS Take 2 capsules by mouth daily.   . psyllium (HYDROCIL/METAMUCIL) 95 % PACK Take 1 packet by mouth daily.  . vitamin E 400 UNIT capsule Take 400 Units by mouth daily.     Allergies:   Patient has no known allergies.   Social History   Socioeconomic History  . Marital status: Married    Spouse name:  Not on file  . Number of children: Not on file  . Years of education: Not on file  . Highest education level: Not on file  Occupational History  . Not on file  Social Needs  . Financial resource strain: Not on file  . Food insecurity:    Worry: Not on file    Inability: Not on file  . Transportation needs:    Medical: Not on file    Non-medical: Not on file  Tobacco Use  . Smoking status: Never Smoker  . Smokeless tobacco: Never Used  Substance and Sexual Activity  . Alcohol use: Yes    Alcohol/week: 0.0 standard drinks    Comment: OCCASIONALLY  . Drug use: No  . Sexual activity: Yes    Partners: Male    Birth control/protection: Surgical    Comment: 1st intercourse- 2, partners- 36, married- 77 yrs   Lifestyle  . Physical activity:    Days per week: Not on file    Minutes per session: Not on file  . Stress: Not on file  Relationships  . Social connections:    Talks on phone: Not on file    Gets together: Not on file    Attends religious service: Not on file    Active member of club or organization: Not on file    Attends meetings of clubs  or organizations: Not on file    Relationship status: Not on file  Other Topics Concern  . Not on file  Social History Narrative  . Not on file     Family History: The patient's family history includes Colon cancer in her maternal grandmother and mother; Hypertension in her father and mother. There is no history of Breast cancer. ROS:   Please see the history of present illness.    All 14 point review of systems negative except as described per history of present illness  EKGs/Labs/Other Studies Reviewed:      Recent Labs: 06/05/2018: ALT 10; BUN 12; Creat 0.64; Hemoglobin 12.6; Platelets 314; Potassium 4.5; Sodium 139; TSH 1.46  Recent Lipid Panel    Component Value Date/Time   CHOL 217 (H) 06/05/2018 0856   TRIG 82 06/05/2018 0856   HDL 75 06/05/2018 0856   CHOLHDL 2.9 06/05/2018 0856   VLDL 10 03/25/2016 0827    LDLCALC 124 (H) 06/05/2018 0856    Physical Exam:    VS:  BP 126/82   Pulse 80   Ht 5' 5.5" (1.664 m)   Wt 159 lb (72.1 kg)   SpO2 96%   BMI 26.06 kg/m     Wt Readings from Last 3 Encounters:  06/19/18 159 lb (72.1 kg)  06/08/18 161 lb (73 kg)  05/16/18 163 lb (73.9 kg)     GEN:  Well nourished, well developed in no acute distress HEENT: Normal NECK: No JVD; No carotid bruits LYMPHATICS: No lymphadenopathy CARDIAC: RRR, holosystolic murmur grade 1/6 best heard left border sternum, no rubs, no gallops RESPIRATORY:  Clear to auscultation without rales, wheezing or rhonchi  ABDOMEN: Soft, non-tender, non-distended MUSCULOSKELETAL:  No edema; No deformity  SKIN: Warm and dry LOWER EXTREMITIES: no swelling NEUROLOGIC:  Alert and oriented x 3 PSYCHIATRIC:  Normal affect   ASSESSMENT:    1. Chest pain, unspecified type   2. Heart murmur   3. Dyslipidemia    PLAN:    In order of problems listed above:  1. Chest pain denies having any.  I will schedule her to have calcium score. 2. Heart murmur again mild AI trace TR trace MR none of this required any intervention. 3. Dyslipidemia calcium score will be done to decide about potentially therapy.   Medication Adjustments/Labs and Tests Ordered: Current medicines are reviewed at length with the patient today.  Concerns regarding medicines are outlined above.  Orders Placed This Encounter  Procedures  . CT CARDIAC SCORING   Medication changes: No orders of the defined types were placed in this encounter.   Signed, Park Liter, MD, [facility] 06/19/2018 5:10 PM    [place]

## 2018-06-19 NOTE — Patient Instructions (Addendum)
Medication Instructions:  Your physician recommends that you continue on your current medications as directed. Please refer to the Current Medication list given to you today.  If you need a refill on your cardiac medications before your next appointment, please call your pharmacy.   Lab work: None.  If you have labs (blood work) drawn today and your tests are completely normal, you will receive your results only by: Marland Kitchen MyChart Message (if you have MyChart) OR . A paper copy in the mail If you have any lab test that is abnormal or we need to change your treatment, we will call you to review the results.  Testing Procedures: Non-Cardiac CT scanning, (CAT scanning), is a noninvasive, special x-ray that produces cross-sectional images of the body using x-rays and a computer. CT scans help physicians diagnose and treat medical conditions. For some CT exams, a contrast material is used to enhance visibility in the area of the body being studied. CT scans provide greater clarity and reveal more details than regular x-ray exams.      Follow-Up: At [facility], you and your health needs are our priority.  As part of our continuing mission to provide you with exceptional heart care, we have created designated Provider Care Teams.  These Care Teams include your primary Cardiologist (physician) and Advanced Practice Providers (APPs -  Physician Assistants and Nurse Practitioners) who all work together to provide you with the care you need, when you need it. You will need a follow up appointment in 6 months.  Please call our office 2 months in advance to schedule this appointment.  You may see Jenne Campus, MD or another member of our [place] Provider Team in [place]: Shirlee More, MD . Jyl Heinz, MD  Any Other Special Instructions Will Be Listed Below (If Applicable).

## 2018-07-04 ENCOUNTER — Inpatient Hospital Stay: Admission: RE | Admit: 2018-07-04 | Payer: 59 | Source: Ambulatory Visit

## 2018-07-13 ENCOUNTER — Ambulatory Visit (INDEPENDENT_AMBULATORY_CARE_PROVIDER_SITE_OTHER)
Admission: RE | Admit: 2018-07-13 | Discharge: 2018-07-13 | Disposition: A | Discharge status: Home / Self Care | Payer: 59 | Source: Ambulatory Visit | Attending: Cardiology | Admitting: Cardiology

## 2018-07-13 DIAGNOSIS — R079 Chest pain, unspecified: Secondary | ICD-10-CM

## 2018-09-11 DIAGNOSIS — H40013 Open angle with borderline findings, low risk, bilateral: Secondary | ICD-10-CM | POA: Diagnosis not present

## 2018-09-11 DIAGNOSIS — H04123 Dry eye syndrome of bilateral lacrimal glands: Secondary | ICD-10-CM | POA: Diagnosis not present

## 2018-09-18 DIAGNOSIS — H04123 Dry eye syndrome of bilateral lacrimal glands: Secondary | ICD-10-CM | POA: Diagnosis not present

## 2018-09-18 DIAGNOSIS — H40033 Anatomical narrow angle, bilateral: Secondary | ICD-10-CM | POA: Diagnosis not present

## 2018-09-18 DIAGNOSIS — H40013 Open angle with borderline findings, low risk, bilateral: Secondary | ICD-10-CM | POA: Diagnosis not present

## 2018-10-09 ENCOUNTER — Other Ambulatory Visit: Payer: Self-pay

## 2018-10-09 ENCOUNTER — Ambulatory Visit: Payer: 59 | Admitting: Obstetrics & Gynecology

## 2018-10-09 ENCOUNTER — Encounter: Payer: Self-pay | Admitting: Obstetrics & Gynecology

## 2018-10-09 VITALS — BP 136/88

## 2018-10-09 DIAGNOSIS — R350 Frequency of micturition: Secondary | ICD-10-CM | POA: Diagnosis not present

## 2018-10-09 DIAGNOSIS — R102 Pelvic and perineal pain: Secondary | ICD-10-CM

## 2018-10-09 NOTE — Progress Notes (Signed)
    Jennifer Dougherty 03-Mar-1961 [id number]        58 y.o.  G2P2L2 Married  RP: Lower abdominal pressure, lowerback pain frequently x 05/2018  HPI: S/P LAVH.  C/O lower abdominal pressure with lower back pain.  Mildly bloated x 05/2018.  [place] 05/2018 Diverticulosis, started eating more fibers.  Changed her diet for weight loss at that time too, lost 12 Lbs since then.  Difficulty emptying her bladder completely.  Nocturia stable.  Mother Dxed with Uterine Ca recently.     OB History  Gravida Para Term Preterm AB Living  2 2       2   SAB TAB Ectopic Multiple Live Births               # Outcome Date GA Lbr Len/2nd Weight Sex Delivery Anes PTL Lv  2 Para           1 Para             Past medical history,surgical history, problem list, medications, allergies, family history and social history were all reviewed and documented in the EPIC chart.   Directed ROS with pertinent positives and negatives documented in the history of present illness/assessment and plan.  Exam:  Vitals:   10/09/18 1427  BP: 136/88   General appearance:  Normal  Abdomen: Normal.  Soft, NT, no mass felt.    Gynecologic exam: Vulva normal.  Speculum:  Vagina normal.  Normal secretions.  Bimanual exam:  No pelvic mass, NT.  No Colpocele, no Cystocele, Rectocele grade 1/3.  U/A: Yellow clear, proteins negative, nitrites negative, white blood cells negative, red blood cells negative, bacteria negative.  No indication for urine culture.   Assessment/Plan:  58 y.o. G2P2   1. Pelvic pain in female Gynecologic exam status post LAVH with a very small grade 1/3 rectocele.  No pelvic mass felt.  Will do a pelvic ultrasound to rule out any ovarian pathology. - US Transvaginal Non-OB; Future - Urinalysis,Complete w/RFL Culture  2. Urinary frequency Probably urinary frequency because patient has a hard time emptying her bladder completely.  Urine analysis completely negative.  Patient reassured.  Counseling done  on bladder management.  Other orders - REFLEXIVE URINE CULTURE  Counseling on above issues and coordination of care more than 50% for 25 minutes.  Princess Bruins MD, 2:34 PM 10/09/2018

## 2018-10-10 LAB — URINALYSIS, COMPLETE W/RFL CULTURE
Bacteria, UA: NONE SEEN /HPF
Bilirubin Urine: NEGATIVE
GLUCOSE, UA: NEGATIVE
Hyaline Cast: NONE SEEN /LPF
Ketones, ur: NEGATIVE
Leukocyte Esterase: NEGATIVE
NITRITES URINE, INITIAL: NEGATIVE
PH: 7 (ref 5.0–8.0)
Protein, ur: NEGATIVE
RBC / HPF: NONE SEEN /HPF (ref 0–2)
Specific Gravity, Urine: 1.015 (ref 1.001–1.03)
WBC UA: NONE SEEN /HPF (ref 0–5)

## 2018-10-10 LAB — NO CULTURE INDICATED

## 2018-10-13 ENCOUNTER — Other Ambulatory Visit: Payer: Self-pay | Admitting: Obstetrics & Gynecology

## 2018-10-13 DIAGNOSIS — Z1231 Encounter for screening mammogram for malignant neoplasm of breast: Secondary | ICD-10-CM

## 2018-10-16 ENCOUNTER — Encounter: Payer: Self-pay | Admitting: Obstetrics & Gynecology

## 2018-10-16 NOTE — Patient Instructions (Signed)
1. Pelvic pain in female Gynecologic exam status post LAVH with a very small grade 1/3 rectocele.  No pelvic mass felt.  Will do a pelvic ultrasound to rule out any ovarian pathology. - US Transvaginal Non-OB; Future - Urinalysis,Complete w/RFL Culture  2. Urinary frequency Probably urinary frequency because patient has a hard time emptying her bladder completely.  Urine analysis completely negative.  Patient reassured.  Counseling done on bladder management.  Other orders - REFLEXIVE [place], it was a pleasure seeing you today!

## 2018-10-17 ENCOUNTER — Other Ambulatory Visit: Payer: Self-pay

## 2018-10-19 ENCOUNTER — Ambulatory Visit (INDEPENDENT_AMBULATORY_CARE_PROVIDER_SITE_OTHER): Payer: 59

## 2018-10-19 ENCOUNTER — Other Ambulatory Visit: Payer: Self-pay

## 2018-10-19 ENCOUNTER — Ambulatory Visit: Payer: 59 | Admitting: Obstetrics & Gynecology

## 2018-10-19 DIAGNOSIS — N83201 Unspecified ovarian cyst, right side: Secondary | ICD-10-CM | POA: Diagnosis not present

## 2018-10-19 DIAGNOSIS — R102 Pelvic and perineal pain: Secondary | ICD-10-CM

## 2018-10-19 DIAGNOSIS — N83202 Unspecified ovarian cyst, left side: Secondary | ICD-10-CM | POA: Diagnosis not present

## 2018-10-19 NOTE — Progress Notes (Signed)
    Jennifer Dougherty [birth year] [id number]        58 y.o.  G2P2L2   RP: Pelvic pain for Pelvic US  HPI: Menopause, well on no HRT.  No postmenopausal bleeding.  Status post LAVH.  Complains of lower abdominal pressure and low back pain with mildly bloated feelings which have improved since last visit with a change in her diet.   OB History  Gravida Para Term Preterm AB Living  2 2       2   SAB TAB Ectopic Multiple Live Births               # Outcome Date GA Lbr Len/2nd Weight Sex Delivery Anes PTL Lv  2 Para           1 Para             Past medical history,surgical history, problem list, medications, allergies, family history and social history were all reviewed and documented in the EPIC chart.   Directed ROS with pertinent positives and negatives documented in the history of present illness/assessment and plan.  Exam:  There were no vitals filed for this visit. General appearance:  Normal  Pelvic US today: T/V and T/A images.  Status post hysterectomy.  Vaginal cuff normal.  Right ovary with hyperechoic/cystic focal area measuring 2.2 x 1.5 cm, suggestive of possible ovarian cyst remnant, negative color flow Doppler.  Left ovary with continued presence of a stable echo-free cyst measuring 0.9 x 1.1 cm.  No free fluid in the pelvis.   Assessment/Plan:  58 y.o. G2P2   1. Pelvic pain in female Probably due to bloating, which has improved with a change in diet.  Nutrition further discussed today.  Pelvic ultrasound status post hysterectomy showing a normal vaginal cuff with very small benign appearing bilateral ovarian cysts.  Will do a Ca1 25 today and repeat a pelvic ultrasound in 6 months to confirm stability.  Patient reassured.  2. Cysts of both ovaries Benign appearing, very small bilateral ovarian cysts.  Will add a Ca 125 today and confirm stability with a Pelvic US in 6 months. - CA 125 - US Transvaginal Non-OB; Future  Counseling on above issues and coordination  of care more than 50% for 15 minutes.  Princess Bruins MD, 12:17 PM 10/19/2018

## 2018-10-20 ENCOUNTER — Encounter: Payer: Self-pay | Admitting: Obstetrics & Gynecology

## 2018-10-20 LAB — CA 125: CA 125: 3 U/mL (ref <35)

## 2018-10-20 NOTE — Patient Instructions (Signed)
1. Pelvic pain in female Probably due to bloating, which has improved with a change in diet.  Nutrition further discussed today.  Pelvic ultrasound status post hysterectomy showing a normal vaginal cuff with very small benign appearing bilateral ovarian cysts.  Will do a Ca1 25 today and repeat a pelvic ultrasound in 6 months to confirm stability.  Patient reassured.  2. Cysts of both ovaries Benign appearing, very small bilateral ovarian cysts.  Will add a Ca 125 today and confirm stability with a Pelvic US in 6 months. - CA 125 - US Transvaginal Non-OB; Future  [place], it was a pleasure seeing you today!  I will inform you of your results as soon as they are available.

## 2018-10-23 ENCOUNTER — Encounter: Payer: Self-pay | Admitting: *Deleted

## 2018-12-06 ENCOUNTER — Ambulatory Visit: Payer: 59

## 2018-12-13 DIAGNOSIS — Z85820 Personal history of malignant melanoma of skin: Secondary | ICD-10-CM | POA: Diagnosis not present

## 2018-12-13 DIAGNOSIS — Z86018 Personal history of other benign neoplasm: Secondary | ICD-10-CM | POA: Diagnosis not present

## 2018-12-13 DIAGNOSIS — L821 Other seborrheic keratosis: Secondary | ICD-10-CM | POA: Diagnosis not present

## 2018-12-13 DIAGNOSIS — D485 Neoplasm of uncertain behavior of skin: Secondary | ICD-10-CM | POA: Diagnosis not present

## 2018-12-20 ENCOUNTER — Ambulatory Visit: Payer: 59

## 2019-04-05 ENCOUNTER — Other Ambulatory Visit: Payer: Self-pay

## 2019-04-05 ENCOUNTER — Ambulatory Visit
Admission: RE | Admit: 2019-04-05 | Discharge: 2019-04-05 | Disposition: A | Discharge status: Home / Self Care | Payer: 59 | Source: Ambulatory Visit | Attending: Obstetrics & Gynecology | Admitting: Obstetrics & Gynecology

## 2019-04-05 DIAGNOSIS — Z1231 Encounter for screening mammogram for malignant neoplasm of breast: Secondary | ICD-10-CM

## 2019-04-18 ENCOUNTER — Other Ambulatory Visit: Payer: Self-pay

## 2019-04-19 ENCOUNTER — Encounter: Payer: Self-pay | Admitting: Obstetrics & Gynecology

## 2019-04-19 ENCOUNTER — Ambulatory Visit: Payer: 59

## 2019-04-19 ENCOUNTER — Ambulatory Visit: Payer: 59 | Admitting: Obstetrics & Gynecology

## 2019-04-19 DIAGNOSIS — N83201 Unspecified ovarian cyst, right side: Secondary | ICD-10-CM | POA: Diagnosis not present

## 2019-04-19 DIAGNOSIS — R102 Pelvic and perineal pain: Secondary | ICD-10-CM

## 2019-04-19 DIAGNOSIS — Z01419 Encounter for gynecological examination (general) (routine) without abnormal findings: Secondary | ICD-10-CM

## 2019-04-19 DIAGNOSIS — N83202 Unspecified ovarian cyst, left side: Secondary | ICD-10-CM

## 2019-04-19 NOTE — Progress Notes (Signed)
    Jennifer Dougherty [birth year] [id number]        58 y.o.  G2P2L2 Married  RP: Ovarian Cysts and Pelvic Pain for Pelvic US  HPI: Resolved Pelvic Pain.  Ca 125 normal at 3 in 09/2018.  Postmenopausal, well on no HRT.  No PMB.  OB History  Gravida Para Term Preterm AB Living  _0 SAB TAB Ectopic Multiple Live Births               # Outcome Date GA Lbr Len/2nd Weight Sex Delivery Anes PTL Lv  2 Para           1 Para             Past medical history,surgical history, problem list, medications, allergies, family history and social history were all reviewed and documented in the EPIC chart.   Directed ROS with pertinent positives and negatives documented in the history of present illness/assessment and plan.  Exam:  There were no vitals filed for this visit. General appearance:  Normal  Pelvic US today: T/V images.  Comparison is made with previous ultrasound done October 19, 2018.  The uterus is surgically absent.  Vaginal cuff normal.  Both ovaries are identified adjacent to the iliac vessels.  The right ovary is small with atrophic appearance.  The left ovary shows a 1.1 cm simple follicle which is unchanged since the previous scan.  An additional 1 cm avascular cystic area with debris is seen today, but after reviewing the previous images of the March 2020 scan, this area was most likely also present at that time.  No free fluid in the posterior cul-de-sac.   Assessment/Plan:  58 y.o. G2P2   1. Cysts of both ovaries Pelvic ultrasound findings thoroughly reviewed with patient.  Status post total hysterectomy.  Right ovary is small with atrophic appearance.  Left ovary shows a 1.1 cm simple follicle unchanged since previous scan.  A 1 cm avascular cystic area with debris appears stable as well compared to the images of the previous scan.  No free fluid in the posterior cul-de-sac.  Ca125 was normal at 3 in March 2020.  Patient reassured.  2. Pelvic pain in female  Resolved pelvic pain.  3. Well woman exam - CBC; Future - Comp Met (CMET); Future - TSH; Future - Lipid panel; Future - VITAMIN D 25 Hydroxy (Vit-D Deficiency, Fractures); Future  Follow-up with annual gynecologic exam in November 2020.  Counseling on above issues and coordination of care more than 50% for 15 minutes.  Princess Bruins MD, 10:57 AM 04/19/2019

## 2019-04-19 NOTE — Patient Instructions (Signed)
1. Cysts of both ovaries Pelvic ultrasound findings thoroughly reviewed with patient.  Status post total hysterectomy.  Right ovary is small with atrophic appearance.  Left ovary shows a 1.1 cm simple follicle unchanged since previous scan.  A 1 cm avascular cystic area with debris appears stable as well compared to the images of the previous scan.  No free fluid in the posterior cul-de-sac.  Ca125 was normal at 3 in March 2020.  Patient reassured.  2. Pelvic pain in female Resolved pelvic pain.  3. Well woman exam - CBC; Future - Comp Met (CMET); Future - TSH; Future - Lipid panel; Future - VITAMIN D 25 Hydroxy (Vit-D Deficiency, Fractures); Future  Follow-up with annual gynecologic exam in November 2020.  [place], it was a pleasure seeing you today! 

## 2019-06-07 ENCOUNTER — Other Ambulatory Visit: Payer: 59

## 2019-06-08 ENCOUNTER — Other Ambulatory Visit: Payer: Self-pay

## 2019-06-08 ENCOUNTER — Encounter: Payer: Self-pay | Admitting: *Deleted

## 2019-06-08 ENCOUNTER — Other Ambulatory Visit: Payer: 59

## 2019-06-08 DIAGNOSIS — Z01419 Encounter for gynecological examination (general) (routine) without abnormal findings: Secondary | ICD-10-CM

## 2019-06-09 LAB — LIPID PANEL
Cholesterol: 231 mg/dL — ABNORMAL HIGH (ref <200)
HDL: 93 mg/dL (ref >=50)
LDL Cholesterol (Calc): 122 mg/dL (calc) — ABNORMAL HIGH
Non-HDL Cholesterol (Calc): 138 mg/dL (calc) — ABNORMAL HIGH (ref <130)
Total CHOL/HDL Ratio: 2.5 (calc) (ref <5.0)
Triglycerides: 66 mg/dL (ref <150)

## 2019-06-09 LAB — TSH: TSH: 1.81 mIU/L (ref 0.40–4.50)

## 2019-06-09 LAB — COMPREHENSIVE METABOLIC PANEL
AG Ratio: 1.8 (calc) (ref 1.0–2.5)
ALT: 15 U/L (ref 6–29)
AST: 15 U/L (ref 10–35)
Albumin: 4.6 g/dL (ref 3.6–5.1)
Alkaline phosphatase (APISO): 44 U/L (ref 37–153)
BUN: 14 mg/dL (ref 7–25)
CO2: 31 mmol/L (ref 20–32)
Calcium: 9.4 mg/dL (ref 8.6–10.4)
Chloride: 102 mmol/L (ref 98–110)
Creat: 0.69 mg/dL (ref 0.50–1.05)
Globulin: 2.6 g/dL (calc) (ref 1.9–3.7)
Glucose, Bld: 84 mg/dL (ref 65–99)
Potassium: 4.4 mmol/L (ref 3.5–5.3)
Sodium: 140 mmol/L (ref 135–146)
Total Bilirubin: 0.5 mg/dL (ref 0.2–1.2)
Total Protein: 7.2 g/dL (ref 6.1–8.1)

## 2019-06-09 LAB — CBC
HCT: 40.2 % (ref 35.0–45.0)
Hemoglobin: 13.1 g/dL (ref 11.7–15.5)
MCH: 28.4 pg (ref 27.0–33.0)
MCHC: 32.6 g/dL (ref 32.0–36.0)
MCV: 87.2 fL (ref 80.0–100.0)
MPV: 11.6 fL (ref 7.5–12.5)
Platelets: 313 10*3/uL (ref 140–400)
RBC: 4.61 10*6/uL (ref 3.80–5.10)
RDW: 12.5 % (ref 11.0–15.0)
WBC: 7.5 10*3/uL (ref 3.8–10.8)

## 2019-06-09 LAB — VITAMIN D 25 HYDROXY (VIT D DEFICIENCY, FRACTURES): Vit D, 25-Hydroxy: 27 ng/mL — ABNORMAL LOW (ref 30–100)

## 2019-06-12 ENCOUNTER — Other Ambulatory Visit: Payer: Self-pay

## 2019-06-13 ENCOUNTER — Ambulatory Visit (INDEPENDENT_AMBULATORY_CARE_PROVIDER_SITE_OTHER): Payer: 59 | Admitting: Obstetrics & Gynecology

## 2019-06-13 ENCOUNTER — Encounter: Payer: Self-pay | Admitting: Obstetrics & Gynecology

## 2019-06-13 VITALS — BP 120/76 | Ht 65.0 in | Wt 157.6 lb

## 2019-06-13 DIAGNOSIS — B3731 Acute candidiasis of vulva and vagina: Secondary | ICD-10-CM

## 2019-06-13 DIAGNOSIS — Z90710 Acquired absence of both cervix and uterus: Secondary | ICD-10-CM

## 2019-06-13 DIAGNOSIS — Z1272 Encounter for screening for malignant neoplasm of vagina: Secondary | ICD-10-CM

## 2019-06-13 DIAGNOSIS — Z8742 Personal history of other diseases of the female genital tract: Secondary | ICD-10-CM

## 2019-06-13 DIAGNOSIS — B373 Candidiasis of vulva and vagina: Secondary | ICD-10-CM

## 2019-06-13 DIAGNOSIS — Z780 Asymptomatic menopausal state: Secondary | ICD-10-CM | POA: Diagnosis not present

## 2019-06-13 DIAGNOSIS — Z01419 Encounter for gynecological examination (general) (routine) without abnormal findings: Secondary | ICD-10-CM

## 2019-06-13 MED ORDER — FLUCONAZOLE 150 MG PO TABS: 150.0000 mg | ORAL_TABLET | Freq: Every day | ORAL | 2 refills | Status: AC

## 2019-06-13 NOTE — Addendum Note (Signed)
Addended by: Thurnell Garbe A on: 06/13/2019 03:31 PM   Modules accepted: Orders

## 2019-06-13 NOTE — Progress Notes (Signed)
Jennifer Dougherty [birth year] [id number]   History:    58 y.o. G2P2L2 Married.  RP:  Established patient presenting for annual gyn exam   HPI: S/P LAVH.  Postmenopause, well on no HRT.  No pelvic pain.  No pain with IC.  Occasional yeast vaginitis.  Pelvic US 03/2019 Left simple cyst 1.1 cm stable.  Breasts normal s/p bilateral implants.  BMI 26.23.  Walking.  [facility] here 05/2019.  Past medical history,surgical history, family history and social history were all reviewed and documented in the EPIC chart.  Gynecologic History No LMP recorded. Patient has had a hysterectomy. Contraception: status post hysterectomy Last Pap: 03/2017. Results were: Negative/HPV HR Negative Last mammogram: 03/2019. Results were: Negative Bone Density: Never Colonoscopy: 2019  Obstetric History OB History  Gravida Para Term Preterm AB Living  2 2       2   SAB TAB Ectopic Multiple Live Births               # Outcome Date GA Lbr Len/2nd Weight Sex Delivery Anes PTL Lv  2 Para           1 Para              ROS: A ROS was performed and pertinent positives and negatives are included in the history.  GENERAL: No fevers or chills. HEENT: No change in vision, no earache, sore throat or sinus congestion. NECK: No pain or stiffness. CARDIOVASCULAR: No chest pain or pressure. No palpitations. PULMONARY: No shortness of breath, cough or wheeze. GASTROINTESTINAL: No abdominal pain, nausea, vomiting or diarrhea, melena or bright red blood per rectum. GENITOURINARY: No urinary frequency, urgency, hesitancy or dysuria. MUSCULOSKELETAL: No joint or muscle pain, no back pain, no recent trauma. DERMATOLOGIC: No rash, no itching, no lesions. ENDOCRINE: No polyuria, polydipsia, no heat or cold intolerance. No recent change in weight. HEMATOLOGICAL: No anemia or easy bruising or bleeding. NEUROLOGIC: No headache, seizures, numbness, tingling or weakness. PSYCHIATRIC: No depression, no loss of interest in  normal activity or change in sleep pattern.     Exam:   BP 120/76   Ht 5\' 5"  (1.651 m)   Wt 157 lb 9.6 oz (71.5 kg)   BMI 26.23 kg/m   Body mass index is 26.23 kg/m.  General appearance : Well developed well nourished female. No acute distress HEENT: Eyes: no retinal hemorrhage or exudates,  Neck supple, trachea midline, no carotid bruits, no thyroidmegaly Lungs: Clear to auscultation, no rhonchi or wheezes, or rib retractions  Heart: Regular rate and rhythm, no murmurs or gallops Breast:Examined in sitting and supine position were symmetrical in appearance, no palpable masses or tenderness,  no skin retraction, no nipple inversion, no nipple discharge, no skin discoloration, no axillary or supraclavicular lymphadenopathy Abdomen: no palpable masses or tenderness, no rebound or guarding Extremities: no edema or skin discoloration or tenderness  Pelvic: Vulva: Normal             Vagina: No gross lesions or discharge.  Pap reflex done  Cervix/Uterus absent  Adnexa  Without masses or tenderness  Anus: Normal   Assessment/Plan:  58 y.o. female for annual exam   1. Encounter for Papanicolaou smear of vagina as part of routine gynecological examination Gynecologic exam status post LAVH.  Pap test negative with negative high-risk HPV in 2019, Pap test repeated this year.  Breast exam status post bilateral implants normal.  Screening mammogram negative in September 2020.  Colonoscopy 2019.  Good body  mass index at 26.23, will increase fitness activities and continue to improve her nutrition.  Based on recent health labs, LDL was mildly increased but HDL was very good, will decrease bad cholesterol in diet.  Will increase vitamin D supplement, as the vitamin D level was at 27, slightly low.  2. S/P total hysterectomy  3. Postmenopausal Postmenopausal, well on no hormone replacement therapy.  4. Vaginal yeast infection Fluconazole prescription sent to pharmacy as needed.  Also  recommend boric acid over-the-counter as needed.  Probiotic or yogurt for prevention.  5. History of ovarian cyst Pelvic ultrasound September 2020 showed a stable simple small follicle measuring 1.1 cm on the left ovary.  We will repeat a pelvic ultrasound at 1 year.  Other orders - Apple Cider Vinegar 600 MG CAPS; Take by mouth. - omeprazole (PRILOSEC) 20 MG capsule; Take 20 mg by mouth daily. - fluconazole (DIFLUCAN) 150 MG tablet; Take 1 tablet (150 mg total) by mouth daily for 3 days.  Princess Bruins MD, 2:14 PM 06/13/2019

## 2019-06-13 NOTE — Patient Instructions (Signed)
1. Encounter for Papanicolaou smear of vagina as part of routine gynecological examination Gynecologic exam status post LAVH.  Pap test negative with negative high-risk HPV in 2019, Pap test repeated this year.  Breast exam status post bilateral implants normal.  Screening mammogram negative in September 2020.  Colonoscopy 2019.  Good body mass index at 26.23, will increase fitness activities and continue to improve her nutrition.  Based on recent health labs, LDL was mildly increased but HDL was very good, will decrease bad cholesterol in diet.  Will increase vitamin D supplement, as the vitamin D level was at 27, slightly low.  2. S/P total hysterectomy  3. Postmenopausal Postmenopausal, well on no hormone replacement therapy.  4. Vaginal yeast infection Fluconazole prescription sent to pharmacy as needed.  Also recommend boric acid over-the-counter as needed.  Probiotic or yogurt for prevention.  5. History of ovarian cyst Pelvic ultrasound September 2020 showed a stable simple small follicle measuring 1.1 cm on the left ovary.  We will repeat a pelvic ultrasound at 1 year.  Other orders - Apple Cider Vinegar 600 MG CAPS; Take by mouth. - omeprazole (PRILOSEC) 20 MG capsule; Take 20 mg by mouth daily. - fluconazole (DIFLUCAN) 150 MG tablet; Take 1 tablet (150 mg total) by mouth daily for 3 days.  [place], it was a pleasure seeing you today!  I will inform you of your results as soon as they are available.

## 2019-06-18 LAB — PAP IG W/ RFLX HPV ASCU

## 2019-08-25 ENCOUNTER — Other Ambulatory Visit: Payer: Self-pay | Admitting: Otolaryngology

## 2019-08-25 DIAGNOSIS — K111 Hypertrophy of salivary gland: Secondary | ICD-10-CM

## 2019-08-29 ENCOUNTER — Other Ambulatory Visit: Payer: Self-pay | Admitting: Otolaryngology

## 2019-08-29 ENCOUNTER — Other Ambulatory Visit (HOSPITAL_COMMUNITY): Payer: Self-pay | Admitting: Otolaryngology

## 2019-08-29 DIAGNOSIS — K1370 Unspecified lesions of oral mucosa: Secondary | ICD-10-CM

## 2019-10-10 DIAGNOSIS — D329 Benign neoplasm of meninges, unspecified: Secondary | ICD-10-CM | POA: Insufficient documentation

## 2019-12-17 ENCOUNTER — Telehealth: Payer: Self-pay | Admitting: *Deleted

## 2019-12-17 DIAGNOSIS — Z1321 Encounter for screening for nutritional disorder: Secondary | ICD-10-CM

## 2019-12-17 DIAGNOSIS — Z1329 Encounter for screening for other suspected endocrine disorder: Secondary | ICD-10-CM

## 2019-12-17 DIAGNOSIS — Z01419 Encounter for gynecological examination (general) (routine) without abnormal findings: Secondary | ICD-10-CM

## 2019-12-17 DIAGNOSIS — Z13220 Encounter for screening for lipoid disorders: Secondary | ICD-10-CM

## 2019-12-17 NOTE — Telephone Encounter (Signed)
Patient called requesting annual labs drawn prior to annual scheduled on 05/21/20. Please advise

## 2019-12-18 NOTE — Telephone Encounter (Signed)
Agree with CBC, CMP, FLP, TSH, Vit D.

## 2019-12-19 NOTE — Telephone Encounter (Signed)
Labs placed, patient schedule for lab appointment on 05/14/20

## 2020-02-25 ENCOUNTER — Other Ambulatory Visit: Payer: Self-pay | Admitting: Obstetrics & Gynecology

## 2020-02-25 DIAGNOSIS — Z1231 Encounter for screening mammogram for malignant neoplasm of breast: Secondary | ICD-10-CM

## 2020-04-08 ENCOUNTER — Other Ambulatory Visit: Payer: Self-pay

## 2020-04-08 ENCOUNTER — Ambulatory Visit
Admission: RE | Admit: 2020-04-08 | Discharge: 2020-04-08 | Disposition: A | Discharge status: Home / Self Care | Payer: 59 | Source: Ambulatory Visit | Attending: Obstetrics & Gynecology | Admitting: Obstetrics & Gynecology

## 2020-04-08 DIAGNOSIS — Z1231 Encounter for screening mammogram for malignant neoplasm of breast: Secondary | ICD-10-CM

## 2020-05-14 ENCOUNTER — Other Ambulatory Visit: Payer: Self-pay

## 2020-05-14 ENCOUNTER — Other Ambulatory Visit: Payer: 59

## 2020-05-14 DIAGNOSIS — Z13220 Encounter for screening for lipoid disorders: Secondary | ICD-10-CM

## 2020-05-14 DIAGNOSIS — Z1321 Encounter for screening for nutritional disorder: Secondary | ICD-10-CM

## 2020-05-14 DIAGNOSIS — Z1329 Encounter for screening for other suspected endocrine disorder: Secondary | ICD-10-CM

## 2020-05-14 DIAGNOSIS — Z01419 Encounter for gynecological examination (general) (routine) without abnormal findings: Secondary | ICD-10-CM

## 2020-05-15 LAB — COMPREHENSIVE METABOLIC PANEL
AG Ratio: 1.6 (calc) (ref 1.0–2.5)
ALT: 15 U/L (ref 6–29)
AST: 16 U/L (ref 10–35)
Albumin: 4.4 g/dL (ref 3.6–5.1)
Alkaline phosphatase (APISO): 43 U/L (ref 37–153)
BUN: 11 mg/dL (ref 7–25)
CO2: 28 mmol/L (ref 20–32)
Calcium: 9.6 mg/dL (ref 8.6–10.4)
Chloride: 102 mmol/L (ref 98–110)
Creat: 0.71 mg/dL (ref 0.50–1.05)
Globulin: 2.8 g/dL (calc) (ref 1.9–3.7)
Glucose, Bld: 87 mg/dL (ref 65–99)
Potassium: 4.5 mmol/L (ref 3.5–5.3)
Sodium: 140 mmol/L (ref 135–146)
Total Bilirubin: 0.6 mg/dL (ref 0.2–1.2)
Total Protein: 7.2 g/dL (ref 6.1–8.1)

## 2020-05-15 LAB — CBC
HCT: 39.9 % (ref 35.0–45.0)
Hemoglobin: 13 g/dL (ref 11.7–15.5)
MCH: 29 pg (ref 27.0–33.0)
MCHC: 32.6 g/dL (ref 32.0–36.0)
MCV: 89.1 fL (ref 80.0–100.0)
MPV: 11.8 fL (ref 7.5–12.5)
Platelets: 305 10*3/uL (ref 140–400)
RBC: 4.48 10*6/uL (ref 3.80–5.10)
RDW: 12.9 % (ref 11.0–15.0)
WBC: 8.1 10*3/uL (ref 3.8–10.8)

## 2020-05-15 LAB — VITAMIN D 25 HYDROXY (VIT D DEFICIENCY, FRACTURES): Vit D, 25-Hydroxy: 29 ng/mL — ABNORMAL LOW (ref 30–100)

## 2020-05-15 LAB — LIPID PANEL
Cholesterol: 255 mg/dL — ABNORMAL HIGH (ref <200)
HDL: 94 mg/dL (ref >=50)
LDL Cholesterol (Calc): 142 mg/dL (calc) — ABNORMAL HIGH
Non-HDL Cholesterol (Calc): 161 mg/dL (calc) — ABNORMAL HIGH (ref <130)
Total CHOL/HDL Ratio: 2.7 (calc) (ref <5.0)
Triglycerides: 82 mg/dL (ref <150)

## 2020-05-15 LAB — TSH: TSH: 1.69 mIU/L (ref 0.40–4.50)

## 2020-05-21 ENCOUNTER — Other Ambulatory Visit: Payer: Self-pay

## 2020-05-21 ENCOUNTER — Telehealth: Payer: Self-pay | Admitting: *Deleted

## 2020-05-21 ENCOUNTER — Encounter: Payer: Self-pay | Admitting: Obstetrics & Gynecology

## 2020-05-21 ENCOUNTER — Ambulatory Visit (INDEPENDENT_AMBULATORY_CARE_PROVIDER_SITE_OTHER): Payer: 59 | Admitting: Obstetrics & Gynecology

## 2020-05-21 VITALS — BP 126/80 | Ht 65.0 in | Wt 158.8 lb

## 2020-05-21 DIAGNOSIS — Z780 Asymptomatic menopausal state: Secondary | ICD-10-CM | POA: Diagnosis not present

## 2020-05-21 DIAGNOSIS — Z01419 Encounter for gynecological examination (general) (routine) without abnormal findings: Secondary | ICD-10-CM

## 2020-05-21 DIAGNOSIS — N83202 Unspecified ovarian cyst, left side: Secondary | ICD-10-CM | POA: Diagnosis not present

## 2020-05-21 DIAGNOSIS — E7800 Pure hypercholesterolemia, unspecified: Secondary | ICD-10-CM

## 2020-05-21 DIAGNOSIS — Z90710 Acquired absence of both cervix and uterus: Secondary | ICD-10-CM | POA: Diagnosis not present

## 2020-05-21 NOTE — Progress Notes (Signed)
Jennifer Dougherty 04/13/1961 [id number]   History:    59 y.o. G2P2L2 Married.  Patient's mother died of Uterine Ca 2019/11/16. Daughter divorced last year.  Son had a DUI a year ago, doing better.  RP:  Established patient presenting for annual gyn exam   HPI: S/P LAVH.  Postmenopause, well on no HRT.  No pelvic pain.  No pain with IC.  Pelvic US 03/2019 Left simple cyst 1.1 cm stable.  Breasts normal s/p bilateral implants.  BMI stable at 26.43. Walking, started biking.  [facility] here 05/14/2020.  Patient is under a lot of stressors, but nonetheless doing very well and making good plans to improve her fitness and nutrition as well as to solve problems and organize her life to decrease the stressors.  No symptoms of major depression, no suicidal ideation.   Past medical history,surgical history, family history and social history were all reviewed and documented in the EPIC chart.  Gynecologic History No LMP recorded. Patient has had a hysterectomy.  Obstetric History OB History  Gravida Para Term Preterm AB Living  2 2       2   SAB TAB Ectopic Multiple Live Births               # Outcome Date GA Lbr Len/2nd Weight Sex Delivery Anes PTL Lv  2 Para           1 Para              ROS: A ROS was performed and pertinent positives and negatives are included in the history.  GENERAL: No fevers or chills. HEENT: No change in vision, no earache, sore throat or sinus congestion. NECK: No pain or stiffness. CARDIOVASCULAR: No chest pain or pressure. No palpitations. PULMONARY: No shortness of breath, cough or wheeze. GASTROINTESTINAL: No abdominal pain, nausea, vomiting or diarrhea, melena or bright red blood per rectum. GENITOURINARY: No urinary frequency, urgency, hesitancy or dysuria. MUSCULOSKELETAL: No joint or muscle pain, no back pain, no recent trauma. DERMATOLOGIC: No rash, no itching, no lesions. ENDOCRINE: No polyuria, polydipsia, no heat or cold intolerance. No  recent change in weight. HEMATOLOGICAL: No anemia or easy bruising or bleeding. NEUROLOGIC: No headache, seizures, numbness, tingling or weakness. PSYCHIATRIC: No depression, no loss of interest in normal activity or change in sleep pattern.     Exam:   BP 126/80   Ht 5\' 5"  (1.651 m)   Wt 158 lb 12.8 oz (72 kg)   BMI 26.43 kg/m   Body mass index is 26.43 kg/m.  General appearance : Well developed well nourished female. No acute distress HEENT: Eyes: no retinal hemorrhage or exudates,  Neck supple, trachea midline, no carotid bruits, no thyroidmegaly Lungs: Clear to auscultation, no rhonchi or wheezes, or rib retractions  Heart: Regular rate and rhythm, no murmurs or gallops Breast:Examined in sitting and supine position were symmetrical in appearance, no palpable masses or tenderness,  no skin retraction, no nipple inversion, no nipple discharge, no skin discoloration, no axillary or supraclavicular lymphadenopathy Abdomen: no palpable masses or tenderness, no rebound or guarding Extremities: no edema or skin discoloration or tenderness  Pelvic: Vulva: Normal             Vagina: No gross lesions or discharge  Cervix/Uterus absent  Adnexa  Without masses or tenderness  Anus: Normal   Assessment/Plan:  59 y.o. female for annual exam   1. Well female exam with routine gynecological exam Gynecologic exam status post total  hysterectomy.  No indication to repeat a Pap test this year.  Breast exam normal.  Last mammogram September 2021 was negative.  Colonoscopy 2019.  Health labs with family physician.  Body mass index 26.43.  Continue with fitness activities including walking and biking.  Recommend aerobic activities 5 times a week and light weightlifting every 2 days.  Continue with healthy nutrition.  Will refer to nutritionist at [facility] to get recommendations on healthy nutrition and supplements.  2. S/P total hysterectomy  3. Postmenopausal Well on no hormone replacement therapy.   Vitamin D supplements, calcium intake of 1500 mg daily and regular weightbearing physical activity is recommended.  4. Left ovarian cyst No pelvic pain.  Small left ovarian cyst on pelvic ultrasound September 2020.  We will repeat a pelvic ultrasound to follow-up on the left ovary. - US Transvaginal Non-OB; Future  Other orders - vitamin B-12 (CYANOCOBALAMIN) 500 MCG tablet; Take 500 mcg by mouth daily.  Princess Bruins MD, 9:33 AM 05/21/2020

## 2020-05-21 NOTE — Telephone Encounter (Signed)
Referral placed at [facility] , they will call to schedule.

## 2020-05-21 NOTE — Telephone Encounter (Signed)
-----   Message from Princess Bruins, MD sent at 05/21/2020  9:56 AM EDT ----- Regarding: Refer to [place] Refer to Nutritionist for Healthy nutrition and advice on Vitamin Supplements/Lower LDL cholesterol.

## 2020-05-26 NOTE — Telephone Encounter (Signed)
Patient scheduled on 07/04/20

## 2020-06-10 ENCOUNTER — Ambulatory Visit (INDEPENDENT_AMBULATORY_CARE_PROVIDER_SITE_OTHER): Payer: 59 | Admitting: Obstetrics & Gynecology

## 2020-06-10 ENCOUNTER — Ambulatory Visit (INDEPENDENT_AMBULATORY_CARE_PROVIDER_SITE_OTHER): Payer: 59

## 2020-06-10 ENCOUNTER — Encounter: Payer: Self-pay | Admitting: Obstetrics & Gynecology

## 2020-06-10 ENCOUNTER — Other Ambulatory Visit: Payer: Self-pay

## 2020-06-10 VITALS — BP 118/80

## 2020-06-10 DIAGNOSIS — N83202 Unspecified ovarian cyst, left side: Secondary | ICD-10-CM

## 2020-06-10 NOTE — Progress Notes (Signed)
    Jennifer Dougherty 1960-11-05 [id number]        59 y.o.  G2P2L2 Married  RP: Small Left ovarian cyst for pelvic US  HPI: S/P LAVH. Postmenopause, well on no HRT. No pelvic pain. No pain with IC. Pelvic US 03/2019 Left simple cyst 1.1 cm stable.   OB History  Gravida Para Term Preterm AB Living  2 2       2   SAB TAB Ectopic Multiple Live Births               # Outcome Date GA Lbr Len/2nd Weight Sex Delivery Anes PTL Lv  2 Para           1 Para             Past medical history,surgical history, problem list, medications, allergies, family history and social history were all reviewed and documented in the EPIC chart.   Directed ROS with pertinent positives and negatives documented in the history of present illness/assessment and plan.  Exam:  Vitals:   06/10/20 1406  BP: 118/80   General appearance:  Normal  Pelvic US today: Comparison is made with previous scan April 19, 2019.  The uterus is surgically absent.  Vaginal cuff normal.  Right ovary small with atrophic appearance.  Left ovary with similar appearance to previous scan showing 2 avascular cystic structures with no significant change, a simple follicular-like cyst measured at 1.3 x 1.3 cm and a cystic structure with debris is measured at 1.3 x 1.3 cm.  No free fluid in the pelvis.   Assessment/Plan:  60 y.o. G2P2   1. Left ovarian cyst Completely stable and benign looking left ovarian cysts x2, each measured at 1.3 cm.  Patient reassured.  Prefers to follow with pelvic ultrasound annually.  Princess Bruins MD, 2:13 PM 06/10/2020

## 2020-06-12 ENCOUNTER — Other Ambulatory Visit: Payer: 59

## 2020-06-12 ENCOUNTER — Ambulatory Visit: Payer: 59 | Admitting: Obstetrics & Gynecology

## 2020-06-16 ENCOUNTER — Encounter: Payer: Self-pay | Admitting: Obstetrics & Gynecology

## 2020-07-04 ENCOUNTER — Encounter: Payer: 59 | Attending: Obstetrics & Gynecology | Admitting: Dietician

## 2020-07-04 ENCOUNTER — Other Ambulatory Visit: Payer: Self-pay

## 2020-07-04 ENCOUNTER — Encounter: Payer: Self-pay | Admitting: Dietician

## 2020-07-04 DIAGNOSIS — E785 Hyperlipidemia, unspecified: Secondary | ICD-10-CM

## 2020-07-04 NOTE — Patient Instructions (Addendum)
Recommend getting your A1C checked due to your history of GDM. Consider increasing your Vitamin D3 to 3,000 units daily 400 mg magnesium each evening would be acceptable  Consider starting Turmeric Consider chia seeds or ground flax seed to your oatmeal. Mindfulness:  Eat without distraction, eat slowly, stop when satisfied or full Goal for fiber:  At least 25 grams of fiber per day. Resource:  Nutrition Facts.org   Eat more Non-Starchy Vegetables . These include greens, broccoli, cauliflower, cabbage, carrots, beets, eggplant, peppers, squash and others. Minimize added sugars and refined grains . Rethink what you drink.  Choose beverages without added sugar.  Look for 0 carbs on the label. . See the list of whole grains below.  Find alternatives to usual sweet treats. Choose whole foods over processed. Make simple meals at home more often than eating out.  Tips to increase fiber in your diet:  (All plants have fiber.  Eat a variety. There are more than are on this list.) Slowly increase the amount of fiber you eat to 25-35 grams per day.  (More is fine if you tolerate it.) . Fiber from whole grains, nuts and seeds o Quinoa, 1/2 cup = 5 grams o Bulgur, 1/2 cup = 4.1 grams o Popcorn, 3 cups = 3.6 grams o Whole Wheat Spaghetti, 1/2 cup = 3.2 grams o Barley, 1/2 cup = 3 grams o Oatmeal, 1/2 cup = 2 grams o Whole Wheat English Muffin = 3 grams o Corn, 1/2 cup = 2.1 grams o Brown Rice, 1/2 cup = 1.8 grams o Flax seeds, 1 Tablespoon = 2.8 grams o Chia seeds, 1 Tablespoon = 11 grams o Almonds, 1 ounce = 3.5 grams fiber . Fiber from legumes o Kidney beans, 1/2 cup 7.9 grams o Lentils, 1/2 cup = 7.8 grams o Pinto beans, 1/2 cup = 7.7 grams o Black beans, 1/2 cup = 7.6 grams o Lima beans, 1/2 cup 6.4 grams o Chick peas, 1/2 cup = 5.3 grams o Black eyed peas, 1/2 cup = 4 grams . Fiber from fruits and vegetables o Pear, 6 grams o Apple. 3.3 grams o Raspberries or Blackberries, 3/4 cup  = 6 grams o Strawberries or Blueberries, 1 cup = 3.4 grams o Baked sweet potato 3.8 grams fiber o Baked potato with skin 4.4 grams  o Peas, 1/2 cup = 4.4 grams  o Spinach, 1/2 cup cooked = 3.5 grams  o Avocado, 1/2 = 5 grams

## 2020-07-04 NOTE — Progress Notes (Signed)
Medical Nutrition Therapy:  Appt start time: 3235 end time:  1150.   Assessment:  Primary concerns today: .  Patient is here today alone.  She would like to lose weight and has been referred due to high cholesterol. Her mother's cholesterol was always high with a high HDL as well.  Her mother had no heart issues.  She states that she had a heart scan several years ago which was normal.  Cholesterol and LDL has risen over the last 5 years to the highest point currently. She also states that her vitamin D is low.  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  She also states that she would like to learn more about treatment for constipation. She states that she has increased aches and pains, inflamation She has a history of GDM.  Sleep improving since sleeping separately most nights - 7 hours per night. Wakes once each night to use a restroom. Labs noted to include:  Glucose 87, Cholesterol 255, HDL 94, LDL 142, Triglycerides 82, Vitamin D 29 Supplements include Calcium and vitamin D, Vitamin D3 2,000 units daily, Vitamin E for breast tenderness, Omega 3  Weight hx: 158 lbs UBW for the past 10 years  Patient lives with her husband and 57 yo son.  She is currently not working since [employer].  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  She does the shopping and her husband and son due the cooking. Increased stress over the past year.  Daughter getting a divorce, son moved home after an accident, mom passed spring 2020.  Preferred Learning Style:   Auditory  Visual  Hands on  No preference indicated   Learning Readiness:   Ready  Change in progress  DIETARY INTAKE:  Beans about 3 times weekly. Infrequent bread, open faced sandwiches.    24-hr recall:  B ( AM): 1/4 cup cooked oatmeal, cinnamon, honey, walnuts, berries OR greek yogurt  Snk ( AM): occasional fruit  L ( PM): salad (iceburg), greek or  balsamic dressing, wheat thins or saltines, occasional feta OR leftovers AND 1/3 [employer] snack bar Snk ( PM): D ( PM): veggi bacon quiche, fruit OR Mediterranean food out (chicken skewer, [employer] pita, double salad, cucumber sauce) OR red meat, starch, vegetable and 1/3 [employer] snack bar Snk ( PM):  Beverages: water (64 oz daily), wine (2 per night on the weekend - 3 days per week), decaf unsweetened tea or green tea, lemon juice in hot water  Usual physical activity: Riding bikes since September but less since it is colder (20-40 minute ride in her neighborhood 2 times per week) and will change to treadmill when weather is better.  Progress Towards Goal(s):  In progress.   Nutritional Diagnosis:  NB-1.1 Food and nutrition-related knowledge deficit As related to nutrition and dyslipidemia.  As evidenced by patient report.    Intervention:  Nutrition education related to dyslipidemia provided.  Discussed types of fats and that saturated fat should be limited.  Discussed benefits of exercise.  Discussed benefits of increasing fiber (cholesterol, gut biome, weight management, and constipation). Discussed mindfulness related to eating. Answered her questions regarding supplements.  Options related to inflammation provided.  Discussed benefits of increased plant based meals.  Plan: Recommend getting your A1C checked due to your history of GDM. Consider increasing your Vitamin D3 to 3,000 units daily 400 mg magnesium each evening would be acceptable  Consider starting Turmeric Consider chia seeds or ground flax seed to your oatmeal. Mindfulness:  Eat without distraction, eat slowly, stop when satisfied or full Goal for fiber:  At least 25 grams of fiber per day.  Eat more Non-Starchy Vegetables . These include greens, broccoli, cauliflower, cabbage, carrots, beets, eggplant, peppers, squash and others. Minimize added sugars and refined grains . Rethink what you drink.  Choose beverages without added  sugar.  Look for 0 carbs on the label. . See the list of whole grains below.  Find alternatives to usual sweet treats. Choose whole foods over processed. Make simple meals at home more often than eating out.  Tips to increase fiber in your diet:  (All plants have fiber.  Eat a variety. There are more than are on this list.) Slowly increase the amount of fiber you eat to 25-35 grams per day.  (More is fine if you tolerate it.) . Fiber from whole grains, nuts and seeds o Quinoa, 1/2 cup = 5 grams o Bulgur, 1/2 cup = 4.1 grams o Popcorn, 3 cups = 3.6 grams o Whole Wheat Spaghetti, 1/2 cup = 3.2 grams o Barley, 1/2 cup = 3 grams o Oatmeal, 1/2 cup = 2 grams o Whole Wheat English Muffin = 3 grams o Corn, 1/2 cup = 2.1 grams o Brown Rice, 1/2 cup = 1.8 grams o Flax seeds, 1 Tablespoon = 2.8 grams o Chia seeds, 1 Tablespoon = 11 grams o Almonds, 1 ounce = 3.5 grams fiber . Fiber from legumes o Kidney beans, 1/2 cup 7.9 grams o Lentils, 1/2 cup = 7.8 grams o Pinto beans, 1/2 cup = 7.7 grams o Black beans, 1/2 cup = 7.6 grams o Lima beans, 1/2 cup 6.4 grams o Chick peas, 1/2 cup = 5.3 grams o Black eyed peas, 1/2 cup = 4 grams . Fiber from fruits and vegetables o Pear, 6 grams o Apple. 3.3 grams o Raspberries or Blackberries, 3/4 cup = 6 grams o Strawberries or Blueberries, 1 cup = 3.4 grams o Baked sweet potato 3.8 grams fiber o Baked potato with skin 4.4 grams  o Peas, 1/2 cup = 4.4 grams  o Spinach, 1/2 cup cooked = 3.5 grams  o Avocado, 1/2 = 5 grams  Teaching Method Utilized:  Auditory  Handouts given during visit include:  Cholesterol and Triglycerides  Types of Fat  Mediterranean diet  Barriers to learning/adherence to lifestyle change: none  Demonstrated degree of understanding via:  Teach Back   Monitoring/Evaluation:  Dietary intake, exercise, and body weight prn.

## 2020-08-08 ENCOUNTER — Telehealth: Payer: Self-pay | Admitting: *Deleted

## 2020-08-08 NOTE — Telephone Encounter (Signed)
Patient informed. 

## 2020-08-08 NOTE — Telephone Encounter (Signed)
Yes, given that she is up to date with Annual-Gyn exam 04/2020.

## 2020-08-08 NOTE — Telephone Encounter (Signed)
Patient called will be working of [employer] and they require a health examination sheet stating patient has not issues with vision, hearing, heart, lung and no lifting or carrying problems. Patient asked if you would be willing to sign off on this? She has PCP however she has not seen him in years. Please advise

## 2020-08-18 ENCOUNTER — Telehealth: Payer: Self-pay | Admitting: *Deleted

## 2020-08-18 ENCOUNTER — Encounter: Payer: Self-pay | Admitting: Anesthesiology

## 2020-08-18 NOTE — Telephone Encounter (Signed)
Patient called c/o yeast infection itching and slight white discharge. Patient reports history of yeast infection and forgot to ask for a Rx on annual exam in Oct 2020. Patient is requesting diflucan 150 mg tablet # 3. Please advise

## 2020-08-21 MED ORDER — FLUCONAZOLE 150 MG PO TABS: 150.0000 mg | ORAL_TABLET | Freq: Every day | ORAL | 3 refills | Status: DC

## 2020-08-21 NOTE — Telephone Encounter (Signed)
Agree with Fluconazole 150 mg #3, refill x 3.

## 2020-08-21 NOTE — Telephone Encounter (Signed)
Left detailed message on cell per DPR access. Rx sent.  

## 2021-02-26 ENCOUNTER — Other Ambulatory Visit: Payer: Self-pay | Admitting: Obstetrics & Gynecology

## 2021-02-26 DIAGNOSIS — Z1231 Encounter for screening mammogram for malignant neoplasm of breast: Secondary | ICD-10-CM

## 2021-04-17 ENCOUNTER — Ambulatory Visit: Payer: 59

## 2021-05-05 ENCOUNTER — Other Ambulatory Visit: Payer: Self-pay

## 2021-05-05 ENCOUNTER — Ambulatory Visit
Admission: RE | Admit: 2021-05-05 | Discharge: 2021-05-05 | Disposition: A | Discharge status: Home / Self Care | Payer: 59 | Source: Ambulatory Visit | Attending: Obstetrics & Gynecology | Admitting: Obstetrics & Gynecology

## 2021-05-05 DIAGNOSIS — Z1231 Encounter for screening mammogram for malignant neoplasm of breast: Secondary | ICD-10-CM

## 2021-05-07 ENCOUNTER — Telehealth: Payer: Self-pay

## 2021-05-07 DIAGNOSIS — Z8632 Personal history of gestational diabetes: Secondary | ICD-10-CM

## 2021-05-07 DIAGNOSIS — Z1211 Encounter for screening for malignant neoplasm of colon: Secondary | ICD-10-CM

## 2021-05-07 DIAGNOSIS — Z1321 Encounter for screening for nutritional disorder: Secondary | ICD-10-CM

## 2021-05-07 DIAGNOSIS — E7800 Pure hypercholesterolemia, unspecified: Secondary | ICD-10-CM

## 2021-05-07 DIAGNOSIS — Z01419 Encounter for gynecological examination (general) (routine) without abnormal findings: Secondary | ICD-10-CM

## 2021-05-07 DIAGNOSIS — Z1329 Encounter for screening for other suspected endocrine disorder: Secondary | ICD-10-CM

## 2021-05-07 NOTE — Telephone Encounter (Signed)
AEX is scheduled for 06/02/21.  Patient would like to come ahead of appointment to have labs drawn. )Has lab appt scheduled.) Please advise orders.  Also, patient is requesting a hemoccult slide test kit. Ok?

## 2021-05-08 NOTE — Telephone Encounter (Signed)
Jennifer Bruins, MD  Ramond Craver, RMA 17 hours ago (9:58 PM)   CBC, CMP, TSH, FLP, Vit D.  Agree with Hemocult.    Orders placed for above labs.  Patient notified. She is scheduled for lab appt on 05/25/21.  Patient also requesting HgbA1c for Hx of GDM, per recommendation from Nutr/Diab (see OV note dated 07/04/20)  Advised I add lab and review with Dr. Dellis Filbert. Our office will return call if any additional  recommendations. Patient agreeable.   Order pended for HgbA1c, ok to add?

## 2021-05-11 NOTE — Telephone Encounter (Signed)
Princess Bruins, MD  You 3 days ago   Agree with HBA1C    Order placed.   Call placed to patient. Left detailed message advising additional lab added. Return call to office if any additional questions.   Encounter closed.

## 2021-05-22 ENCOUNTER — Ambulatory Visit: Payer: 59 | Admitting: Obstetrics & Gynecology

## 2021-05-25 ENCOUNTER — Other Ambulatory Visit: Payer: Self-pay

## 2021-05-25 ENCOUNTER — Other Ambulatory Visit: Payer: 59

## 2021-05-25 DIAGNOSIS — Z8632 Personal history of gestational diabetes: Secondary | ICD-10-CM

## 2021-05-25 DIAGNOSIS — E7800 Pure hypercholesterolemia, unspecified: Secondary | ICD-10-CM

## 2021-05-25 DIAGNOSIS — Z1321 Encounter for screening for nutritional disorder: Secondary | ICD-10-CM

## 2021-05-25 DIAGNOSIS — Z1211 Encounter for screening for malignant neoplasm of colon: Secondary | ICD-10-CM

## 2021-05-25 DIAGNOSIS — Z01419 Encounter for gynecological examination (general) (routine) without abnormal findings: Secondary | ICD-10-CM

## 2021-05-25 DIAGNOSIS — Z1329 Encounter for screening for other suspected endocrine disorder: Secondary | ICD-10-CM

## 2021-05-26 LAB — TSH: TSH: 1.73 mIU/L (ref 0.40–4.50)

## 2021-05-26 LAB — CBC
HCT: 40.7 % (ref 35.0–45.0)
Hemoglobin: 13.2 g/dL (ref 11.7–15.5)
MCH: 28.3 pg (ref 27.0–33.0)
MCHC: 32.4 g/dL (ref 32.0–36.0)
MCV: 87.3 fL (ref 80.0–100.0)
MPV: 11.6 fL (ref 7.5–12.5)
Platelets: 336 10*3/uL (ref 140–400)
RBC: 4.66 10*6/uL (ref 3.80–5.10)
RDW: 13.1 % (ref 11.0–15.0)
WBC: 7.2 10*3/uL (ref 3.8–10.8)

## 2021-05-26 LAB — HEMOGLOBIN A1C
Hgb A1c MFr Bld: 5.7 % of total Hgb — ABNORMAL HIGH (ref <5.7)
Mean Plasma Glucose: 117 mg/dL
eAG (mmol/L): 6.5 mmol/L

## 2021-05-26 LAB — LIPID PANEL
Cholesterol: 230 mg/dL — ABNORMAL HIGH (ref <200)
HDL: 95 mg/dL (ref >=50)
LDL Cholesterol (Calc): 116 mg/dL (calc) — ABNORMAL HIGH
Non-HDL Cholesterol (Calc): 135 mg/dL (calc) — ABNORMAL HIGH (ref <130)
Total CHOL/HDL Ratio: 2.4 (calc) (ref <5.0)
Triglycerides: 86 mg/dL (ref <150)

## 2021-05-26 LAB — COMPREHENSIVE METABOLIC PANEL
AG Ratio: 1.8 (calc) (ref 1.0–2.5)
ALT: 12 U/L (ref 6–29)
AST: 15 U/L (ref 10–35)
Albumin: 4.4 g/dL (ref 3.6–5.1)
Alkaline phosphatase (APISO): 47 U/L (ref 37–153)
BUN: 9 mg/dL (ref 7–25)
CO2: 28 mmol/L (ref 20–32)
Calcium: 9.4 mg/dL (ref 8.6–10.4)
Chloride: 105 mmol/L (ref 98–110)
Creat: 0.71 mg/dL (ref 0.50–1.03)
Globulin: 2.5 g/dL (calc) (ref 1.9–3.7)
Glucose, Bld: 86 mg/dL (ref 65–99)
Potassium: 4.6 mmol/L (ref 3.5–5.3)
Sodium: 141 mmol/L (ref 135–146)
Total Bilirubin: 0.5 mg/dL (ref 0.2–1.2)
Total Protein: 6.9 g/dL (ref 6.1–8.1)

## 2021-05-26 LAB — VITAMIN D 25 HYDROXY (VIT D DEFICIENCY, FRACTURES): Vit D, 25-Hydroxy: 42 ng/mL (ref 30–100)

## 2021-06-02 ENCOUNTER — Ambulatory Visit (INDEPENDENT_AMBULATORY_CARE_PROVIDER_SITE_OTHER): Payer: 59 | Admitting: Obstetrics & Gynecology

## 2021-06-02 ENCOUNTER — Other Ambulatory Visit: Payer: Self-pay

## 2021-06-02 ENCOUNTER — Encounter: Payer: Self-pay | Admitting: Obstetrics & Gynecology

## 2021-06-02 VITALS — BP 114/72 | HR 80 | Resp 16 | Ht 64.5 in | Wt 161.0 lb

## 2021-06-02 DIAGNOSIS — Z90710 Acquired absence of both cervix and uterus: Secondary | ICD-10-CM | POA: Diagnosis not present

## 2021-06-02 DIAGNOSIS — N83202 Unspecified ovarian cyst, left side: Secondary | ICD-10-CM

## 2021-06-02 DIAGNOSIS — Z01419 Encounter for gynecological examination (general) (routine) without abnormal findings: Secondary | ICD-10-CM

## 2021-06-02 DIAGNOSIS — N898 Other specified noninflammatory disorders of vagina: Secondary | ICD-10-CM | POA: Diagnosis not present

## 2021-06-02 DIAGNOSIS — R350 Frequency of micturition: Secondary | ICD-10-CM | POA: Diagnosis not present

## 2021-06-02 LAB — URINALYSIS, COMPLETE W/RFL CULTURE
Bacteria, UA: NONE SEEN /HPF
Bilirubin Urine: NEGATIVE
Casts: NONE SEEN /LPF
Crystals: NONE SEEN /HPF
Glucose, UA: NEGATIVE
Hgb urine dipstick: NEGATIVE
Hyaline Cast: NONE SEEN /LPF
Ketones, ur: NEGATIVE
Leukocyte Esterase: NEGATIVE
Nitrites, Initial: NEGATIVE
Protein, ur: NEGATIVE
RBC / HPF: NONE SEEN /HPF (ref 0–2)
Specific Gravity, Urine: 1.01 (ref 1.001–1.035)
WBC, UA: NONE SEEN /HPF (ref 0–5)
Yeast: NONE SEEN /HPF
pH: 7 (ref 5.0–8.0)

## 2021-06-02 LAB — WET PREP FOR TRICH, YEAST, CLUE

## 2021-06-02 LAB — NO CULTURE INDICATED

## 2021-06-02 MED ORDER — FLUCONAZOLE 150 MG PO TABS: 150.0000 mg | ORAL_TABLET | Freq: Once | ORAL | 3 refills | Status: AC

## 2021-06-02 NOTE — Progress Notes (Signed)
Jennifer Dougherty 1960/09/10 [id number]   History:    60 y.o. G2P2L2 Married.  Patient's mother died of Uterine Ca 05-Dec-2019. Daughter divorced 2 yrs ago.  Son doing well.   RP:  Established patient presenting for annual gyn exam    HPI: S/P LAVH.  Postmenopause, well on no HRT.  No pelvic pain. C/O urinary frequency and warmth when passing urine.  No pain with IC.  Pap Neg 2020.  Pelvic US 05/2020 Left simple ovarian cysts.  Breasts normal s/p bilateral implants. Screening mammo Neg 04/2021.  BMI 27.21.  Planning to restart fitness.  Fasting Health Labs with Fam MD wnl 04/2021.  Colono 05/2018.  Past medical history,surgical history, family history and social history were all reviewed and documented in the EPIC chart.  Gynecologic History No LMP recorded. Patient has had a hysterectomy.  Obstetric History OB History  Gravida Para Term Preterm AB Living  2 2       2   SAB IAB Ectopic Multiple Live Births               # Outcome Date GA Lbr Len/2nd Weight Sex Delivery Anes PTL Lv  2 Para           1 Para              ROS: A ROS was performed and pertinent positives and negatives are included in the history.  GENERAL: No fevers or chills. HEENT: No change in vision, no earache, sore throat or sinus congestion. NECK: No pain or stiffness. CARDIOVASCULAR: No chest pain or pressure. No palpitations. PULMONARY: No shortness of breath, cough or wheeze. GASTROINTESTINAL: No abdominal pain, nausea, vomiting or diarrhea, melena or bright red blood per rectum. GENITOURINARY: No urinary frequency, urgency, hesitancy or dysuria. MUSCULOSKELETAL: No joint or muscle pain, no back pain, no recent trauma. DERMATOLOGIC: No rash, no itching, no lesions. ENDOCRINE: No polyuria, polydipsia, no heat or cold intolerance. No recent change in weight. HEMATOLOGICAL: No anemia or easy bruising or bleeding. NEUROLOGIC: No headache, seizures, numbness, tingling or weakness. PSYCHIATRIC: No depression, no loss  of interest in normal activity or change in sleep pattern.     Exam:   BP 114/72   Pulse 80   Resp 16   Ht 5' 4.5" (1.638 m)   Wt 161 lb (73 kg)   BMI 27.21 kg/m   Body mass index is 27.21 kg/m.  General appearance : Well developed well nourished female. No acute distress HEENT: Eyes: no retinal hemorrhage or exudates,  Neck supple, trachea midline, no carotid bruits, no thyroidmegaly Lungs: Clear to auscultation, no rhonchi or wheezes, or rib retractions  Heart: Regular rate and rhythm, no murmurs or gallops Breast:Examined in sitting and supine position were symmetrical in appearance, no palpable masses or tenderness,  no skin retraction, no nipple inversion, no nipple discharge, no skin discoloration, no axillary or supraclavicular lymphadenopathy Abdomen: no palpable masses or tenderness, no rebound or guarding Extremities: no edema or skin discoloration or tenderness  Pelvic: Vulva: Normal             Vagina: No gross lesions.  Mild discharge.  Wet prep done.  Cervix/Uterus absent  Adnexa  Without masses or tenderness  Anus: Small non thrombosed hemorrhoids  U/A Negative Wet prep: Neg   Assessment/Plan:  60 y.o. female for annual exam   1. Well female exam with routine gynecological exam S/P LAVH.  Postmenopause, well on no HRT.  No pelvic pain. C/O urinary frequency  and warmth when passing urine.  No pain with IC.  Pap Neg 2020.  Pelvic US 05/2020 Left simple ovarian cysts.  Breasts normal s/p bilateral implants. Screening mammo Neg 04/2021.  BMI 27.21.  Planning to restart fitness.  Fasting Health Labs with Fam MD wnl 04/2021.  Colono 05/2018.  2. History of LAVH  3. Left ovarian cyst Will recheck by Pelvic US - US Transvaginal Non-OB; Future  4. Urinary frequency U/A Negative - Urinalysis,Complete w/RFL Culture  5. Vaginal discharge Wet prep Neg.  Will send Fluconazole PRN for the future. - WET PREP FOR TRICH, YEAST, CLUE - fluconazole (DIFLUCAN) 150 MG  tablet; Take 1 tablet (150 mg total) by mouth once for 1 dose.   Other orders - Glucosamine-Chondroitin 500-400 MG CAPS; Takes 3 a day - acetaminophen (TYLENOL) 500 MG tablet; Take by mouth as needed. - ibuprofen (ADVIL) 200 MG tablet; Take by mouth as needed. - REFLEXIVE URINE CULTURE   Princess Bruins MD, 10:55 AM 06/02/2021

## 2021-07-16 ENCOUNTER — Other Ambulatory Visit: Payer: Self-pay

## 2021-07-16 ENCOUNTER — Ambulatory Visit (INDEPENDENT_AMBULATORY_CARE_PROVIDER_SITE_OTHER): Payer: 59

## 2021-07-16 ENCOUNTER — Ambulatory Visit (INDEPENDENT_AMBULATORY_CARE_PROVIDER_SITE_OTHER): Payer: 59 | Admitting: Obstetrics & Gynecology

## 2021-07-16 ENCOUNTER — Encounter: Payer: Self-pay | Admitting: Obstetrics & Gynecology

## 2021-07-16 VITALS — BP 108/78 | HR 89

## 2021-07-16 DIAGNOSIS — N83202 Unspecified ovarian cyst, left side: Secondary | ICD-10-CM

## 2021-07-16 DIAGNOSIS — Z90710 Acquired absence of both cervix and uterus: Secondary | ICD-10-CM | POA: Diagnosis not present

## 2021-07-16 NOTE — Progress Notes (Signed)
° ° °  Jennifer Dougherty 1960/12/29 [id number]        60 y.o.  G2P2L2 Married. Patient's mother died of Uterine Ca 12-09-2019. Daughter divorced 2 yrs ago.  Son doing well.   RP: Left Ovarian Cyst f/u by Pelvic US  HPI: Pelvic US 05/2020 Left simple ovarian cysts.  S/P LAVH.  Postmenopause, well on no HRT.  No pelvic pain.  No pain with IC.     OB History  Gravida Para Term Preterm AB Living  2 2       2   SAB IAB Ectopic Multiple Live Births               # Outcome Date GA Lbr Len/2nd Weight Sex Delivery Anes PTL Lv  2 Para           1 Para             Past medical history,surgical history, problem list, medications, allergies, family history and social history were all reviewed and documented in the EPIC chart.   Directed ROS with pertinent positives and negatives documented in the history of present illness/assessment and plan.  Exam:  Vitals:   07/16/21 1418  BP: 108/78  Pulse: 89  SpO2: 98%   General appearance:  Normal  Pelvic US today: T/V images.  Status post LAVH.  Vaginal cuff normal.  Both ovaries are atrophic in appearance.  Simple left ovarian follicle remnant stable from previous scan.  Left ovarian cystic structure with debris appears smaller on today's scan, measured at 1 x 0.8 cm (previous scan measurements were at 1.3 x 1.3 cm).  Right ovary normal.  No adnexal mass.  No free fluid in the pelvis.   Assessment/Plan:  60 y.o. G2P2   1. Left ovarian cyst No pelvic pain.  Pelvic US findings thoroughly reviewed with patient.  Simple left ovarian follicle remnant stable from previous scan.  Left ovarian cystic structure with debris appears smaller on today's scan, measured at 1 x 0.8 cm (previous scan measurements were at 1.3 x 1.3 cm).  Reassured about the benign appearance of the left ovary.  No FF in the pelvis.  F/U Annual Gyn exam when due.  2. History of LAVH  Other orders - desonide (DESOWEN) 0.05 % cream; Apply topically. - Plant Sterols and Stanols  (CHOLESTOFF PO); Take by mouth.   Princess Bruins MD, 2:51 PM 07/16/2021

## 2021-12-09 DIAGNOSIS — C4360 Malignant melanoma of unspecified upper limb, including shoulder: Secondary | ICD-10-CM | POA: Insufficient documentation

## 2021-12-09 DIAGNOSIS — Z6827 Body mass index (BMI) 27.0-27.9, adult: Secondary | ICD-10-CM | POA: Insufficient documentation

## 2021-12-09 DIAGNOSIS — R030 Elevated blood-pressure reading, without diagnosis of hypertension: Secondary | ICD-10-CM | POA: Insufficient documentation

## 2021-12-09 DIAGNOSIS — E559 Vitamin D deficiency, unspecified: Secondary | ICD-10-CM | POA: Insufficient documentation

## 2021-12-09 DIAGNOSIS — E782 Mixed hyperlipidemia: Secondary | ICD-10-CM | POA: Insufficient documentation

## 2021-12-09 DIAGNOSIS — Z90711 Acquired absence of uterus with remaining cervical stump: Secondary | ICD-10-CM | POA: Insufficient documentation

## 2021-12-09 DIAGNOSIS — R7303 Prediabetes: Secondary | ICD-10-CM | POA: Insufficient documentation

## 2022-03-22 ENCOUNTER — Other Ambulatory Visit: Payer: Self-pay | Admitting: Obstetrics & Gynecology

## 2022-03-22 DIAGNOSIS — Z1231 Encounter for screening mammogram for malignant neoplasm of breast: Secondary | ICD-10-CM

## 2022-05-06 ENCOUNTER — Ambulatory Visit
Admission: RE | Admit: 2022-05-06 | Discharge: 2022-05-06 | Disposition: A | Discharge status: Home / Self Care | Payer: No Typology Code available for payment source | Source: Ambulatory Visit | Attending: Obstetrics & Gynecology | Admitting: Obstetrics & Gynecology

## 2022-05-06 ENCOUNTER — Ambulatory Visit: Payer: Self-pay

## 2022-05-06 DIAGNOSIS — Z1231 Encounter for screening mammogram for malignant neoplasm of breast: Secondary | ICD-10-CM

## 2022-06-02 ENCOUNTER — Ambulatory Visit (INDEPENDENT_AMBULATORY_CARE_PROVIDER_SITE_OTHER): Payer: No Typology Code available for payment source | Admitting: Obstetrics & Gynecology

## 2022-06-02 ENCOUNTER — Encounter: Payer: Self-pay | Admitting: Obstetrics & Gynecology

## 2022-06-02 VITALS — BP 116/76 | HR 77 | Ht 64.25 in | Wt 161.0 lb

## 2022-06-02 DIAGNOSIS — Z90710 Acquired absence of both cervix and uterus: Secondary | ICD-10-CM

## 2022-06-02 DIAGNOSIS — Z780 Asymptomatic menopausal state: Secondary | ICD-10-CM

## 2022-06-02 DIAGNOSIS — Z01419 Encounter for gynecological examination (general) (routine) without abnormal findings: Secondary | ICD-10-CM

## 2022-06-02 DIAGNOSIS — N83202 Unspecified ovarian cyst, left side: Secondary | ICD-10-CM | POA: Diagnosis not present

## 2022-06-02 NOTE — Progress Notes (Signed)
Jennifer Dougherty January 31, 1961 [id number]   History:    61 y.o.  G2P2L2 Married.  Patient's mother died of Uterine Ca 11-23-2019. Daughter divorced 3 yrs ago.  Son doing well.   RP:  Established patient presenting for annual gyn exam    HPI: S/P LAVH.  Postmenopause, well on no HRT.  No pelvic pain.  No pain with IC.  Pap Neg 05/2019. No indication for a Pap test at this time.  Pelvic US 05/2021 Left simple ovarian cyst 1 cm x 0.8 cm. Breasts normal s/p bilateral implants. Screening mammo Neg 04/2022.  BMI 27.42.  Planning to restart fitness.  Fasting Health Labs with Fam MD wnl 04/2021.  Colono 05/2018. Pt had her flu shot done already.   Past medical history,surgical history, family history and social history were all reviewed and documented in the EPIC chart.  Gynecologic History No LMP recorded. Patient has had a hysterectomy.  Obstetric History OB History  Gravida Para Term Preterm AB Living  '2 2 2     2  '$ SAB IAB Ectopic Multiple Live Births               # Outcome Date GA Lbr Len/2nd Weight Sex Delivery Anes PTL Lv  2 Term           1 Term              ROS: A ROS was performed and pertinent positives and negatives are included in the history. GENERAL: No fevers or chills. HEENT: No change in vision, no earache, sore throat or sinus congestion. NECK: No pain or stiffness. CARDIOVASCULAR: No chest pain or pressure. No palpitations. PULMONARY: No shortness of breath, cough or wheeze. GASTROINTESTINAL: No abdominal pain, nausea, vomiting or diarrhea, melena or bright red blood per rectum. GENITOURINARY: No urinary frequency, urgency, hesitancy or dysuria. MUSCULOSKELETAL: No joint or muscle pain, no back pain, no recent trauma. DERMATOLOGIC: No rash, no itching, no lesions. ENDOCRINE: No polyuria, polydipsia, no heat or cold intolerance. No recent change in weight. HEMATOLOGICAL: No anemia or easy bruising or bleeding. NEUROLOGIC: No headache, seizures, numbness, tingling or  weakness. PSYCHIATRIC: No depression, no loss of interest in normal activity or change in sleep pattern.     Exam:   BP 116/76   Pulse 77   Ht 5' 4.25" (1.632 m)   Wt 161 lb (73 kg)   SpO2 98%   BMI 27.42 kg/m   Body mass index is 27.42 kg/m.  General appearance : Well developed well nourished female. No acute distress HEENT: Eyes: no retinal hemorrhage or exudates,  Neck supple, trachea midline, no carotid bruits, no thyroidmegaly Lungs: Clear to auscultation, no rhonchi or wheezes, or rib retractions  Heart: Regular rate and rhythm, no murmurs or gallops Breast:Examined in sitting and supine position were symmetrical in appearance, no palpable masses or tenderness,  no skin retraction, no nipple inversion, no nipple discharge, no skin discoloration, no axillary or supraclavicular lymphadenopathy Abdomen: no palpable masses or tenderness, no rebound or guarding Extremities: no edema or skin discoloration or tenderness  Pelvic: Vulva: Normal             Vagina: No gross lesions or discharge  Cervix/Uterus absent  Adnexa  Without masses or tenderness  Anus: Normal   Assessment/Plan:  61 y.o. female for annual exam   1. Well female exam with routine gynecological exam S/P LAVH.  Postmenopause, well on no HRT.  No pelvic pain.  No pain with IC.  Pap Neg 05/2019. No indication for a Pap test at this time.  Pelvic US 05/2021 Left simple ovarian cyst 1 cm x 0.8 cm. Breasts normal s/p bilateral implants. Screening mammo Neg 04/2022.  BMI 27.42.  Planning to restart fitness.  Fasting Health Labs with Fam MD wnl 04/2021.  Colono 05/2018. Pt had her flu shot done already.  2. History of LAVH  3. Postmenopause S/P LAVH.  Postmenopause, well on no HRT.  No pelvic pain.  No pain with IC.  4. Left ovarian cyst Small simple Lt Ovarian Cyst.  Repeat Pelvic US at f/u. - US Transvaginal Non-OB; Future  Other orders - Psyllium (METAMUCIL PO); Take by mouth.   Princess Bruins MD, 1:45  PM 06/02/2022

## 2022-06-03 ENCOUNTER — Encounter: Payer: Self-pay | Admitting: Obstetrics & Gynecology

## 2022-06-03 ENCOUNTER — Ambulatory Visit (INDEPENDENT_AMBULATORY_CARE_PROVIDER_SITE_OTHER): Payer: No Typology Code available for payment source | Admitting: Obstetrics & Gynecology

## 2022-06-03 ENCOUNTER — Ambulatory Visit (INDEPENDENT_AMBULATORY_CARE_PROVIDER_SITE_OTHER): Payer: No Typology Code available for payment source

## 2022-06-03 VITALS — BP 110/76 | HR 78

## 2022-06-03 DIAGNOSIS — Z90710 Acquired absence of both cervix and uterus: Secondary | ICD-10-CM

## 2022-06-03 DIAGNOSIS — N83202 Unspecified ovarian cyst, left side: Secondary | ICD-10-CM

## 2022-06-03 DIAGNOSIS — Z780 Asymptomatic menopausal state: Secondary | ICD-10-CM

## 2022-06-03 NOTE — Progress Notes (Signed)
    Trichelle Aqueelah Cotrell Aug 12, 1960 [id number]        61 y.o.  G2P2002   RP: Left Ovarian Cyst for f/u Pelvic US  HPI: Benign appearing simple, small left ovarian cyst. No pelvic pain. Last Pelvic US in 06/2021.  S/P LAVH.  Postmenopausal, well on no HRT.  Mother deceased of metastatic uterine cancer.   OB History  Gravida Para Term Preterm AB Living  '2 2 2     2  '$ SAB IAB Ectopic Multiple Live Births               # Outcome Date GA Lbr Len/2nd Weight Sex Delivery Anes PTL Lv  2 Term           1 Term             Past medical history,surgical history, problem list, medications, allergies, family history and social history were all reviewed and documented in the EPIC chart.   Directed ROS with pertinent positives and negatives documented in the history of present illness/assessment and plan.  Exam:  Vitals:   06/03/22 1349  BP: 110/76  Pulse: 78  SpO2: 97%   General appearance:  Normal  Pelvic US today: Comparison is made with previous scans in November 2021 and December 2022.  T/V images.  The uterus is surgically absent.  The vaginal cuff is within normal limits.  Right ovary is small with atrophic appearance.  Left ovary with very similar appearance to previous scans.  A 1.5 x 1.2 cm simple follicle remains.  A 1.6 x 1.4 cm avascular cystic structure with debris with no significant change since 2021.  No other mass in the pelvis.  No free fluid in the pelvis.   Assessment/Plan:  61 y.o. G2P2002   1. Left ovarian cyst Benign appearing simple, small left ovarian cyst. No pelvic pain. Last Pelvic US in 06/2021.  S/P LAVH.  Postmenopausal, well on no HRT.  Mother deceased of metastatic uterine cancer.  No fam h/o Ovarian Ca.  Pelvic US findings thoroughly reviewed with patient.  Images looked at with patient.  Small simple left ovarian cysts x 2, stable c/w Pelvic US in 06/2021 and 05/2020.  Ca 125 normal at 3 in 2020.  Repeat Ca 125 today.  Patient reassured, will continue  to observe. - CA 125  2. History of LAVH  3. Postmenopause  Well on no HRT.  61 Princess Bruins MD, 2:07 PM 06/03/2022

## 2022-06-04 LAB — CA 125: CA 125: 3 U/mL (ref <35)

## 2022-11-03 ENCOUNTER — Other Ambulatory Visit: Payer: Self-pay | Admitting: Obstetrics & Gynecology

## 2022-11-03 MED ORDER — FLUCONAZOLE 150 MG PO TABS: 150.0000 mg | ORAL_TABLET | Freq: Once | ORAL | 3 refills | Status: AC

## 2022-11-03 MED ORDER — FLUCONAZOLE 150 MG PO TABS: 150.0000 mg | ORAL_TABLET | Freq: Once | ORAL | 0 refills | Status: DC

## 2022-11-03 NOTE — Addendum Note (Signed)
Addended by: Keenan Bachelor on: 11/03/2022 04:04 PM   Modules accepted: Orders

## 2022-11-03 NOTE — Telephone Encounter (Signed)
AEX 06/02/2022 

## 2022-11-03 NOTE — Addendum Note (Signed)
Addended by: Theodoro Doing R on: 11/03/2022 04:00 PM   Modules accepted: Orders

## 2023-02-24 DIAGNOSIS — H6501 Acute serous otitis media, right ear: Secondary | ICD-10-CM | POA: Insufficient documentation

## 2023-02-24 DIAGNOSIS — H9011 Conductive hearing loss, unilateral, right ear, with unrestricted hearing on the contralateral side: Secondary | ICD-10-CM | POA: Insufficient documentation

## 2023-04-11 ENCOUNTER — Other Ambulatory Visit: Payer: Self-pay | Admitting: Physician Assistant

## 2023-04-11 DIAGNOSIS — Z1231 Encounter for screening mammogram for malignant neoplasm of breast: Secondary | ICD-10-CM

## 2023-04-15 IMAGING — MG MM DIGITAL SCREENING BILAT W/ TOMO AND CAD
6 of 12 series · 6 of 36 positions shown · non-contrast
Comparison: Previous exam(s).

CLINICAL DATA: Screening.

EXAM:
DIGITAL SCREENING BILATERAL MAMMOGRAM WITH TOMOSYNTHESIS AND CAD
TECHNIQUE: Bilateral screening digital craniocaudal and mediolateral oblique
mammograms were obtained. Bilateral screening digital breast
tomosynthesis was performed. The images were evaluated with
computer-aided detection.

[L CC synth-2D (1 of 2)]
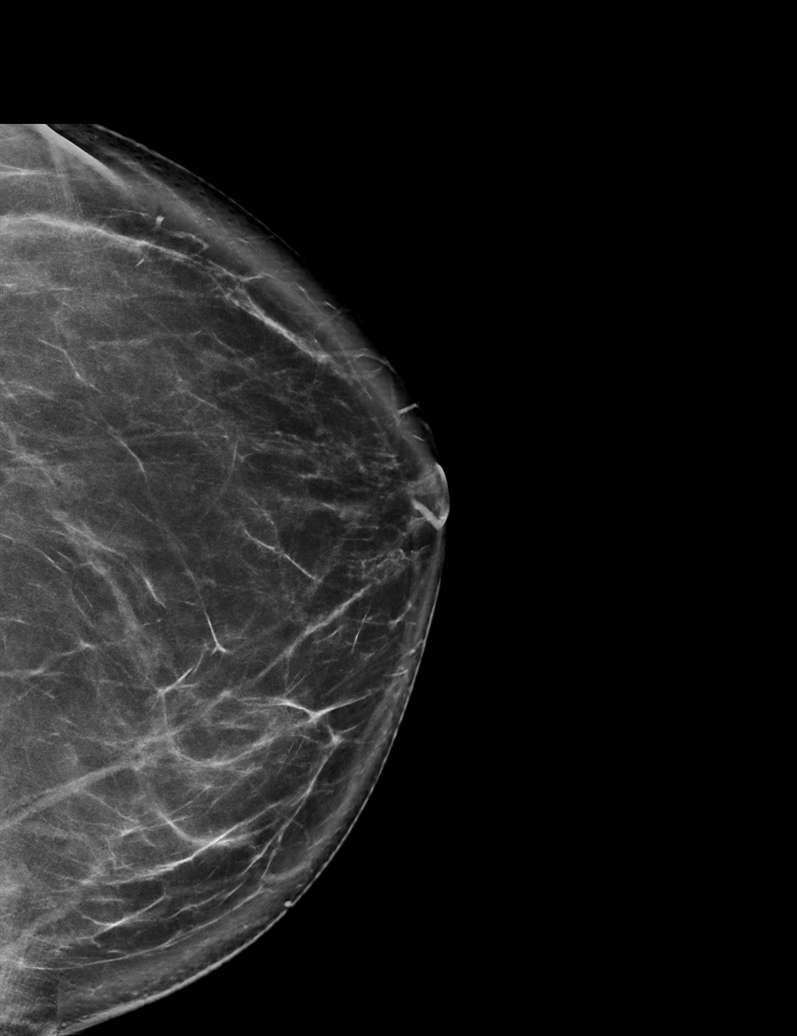

[R CC synth-2D (1 of 2)]
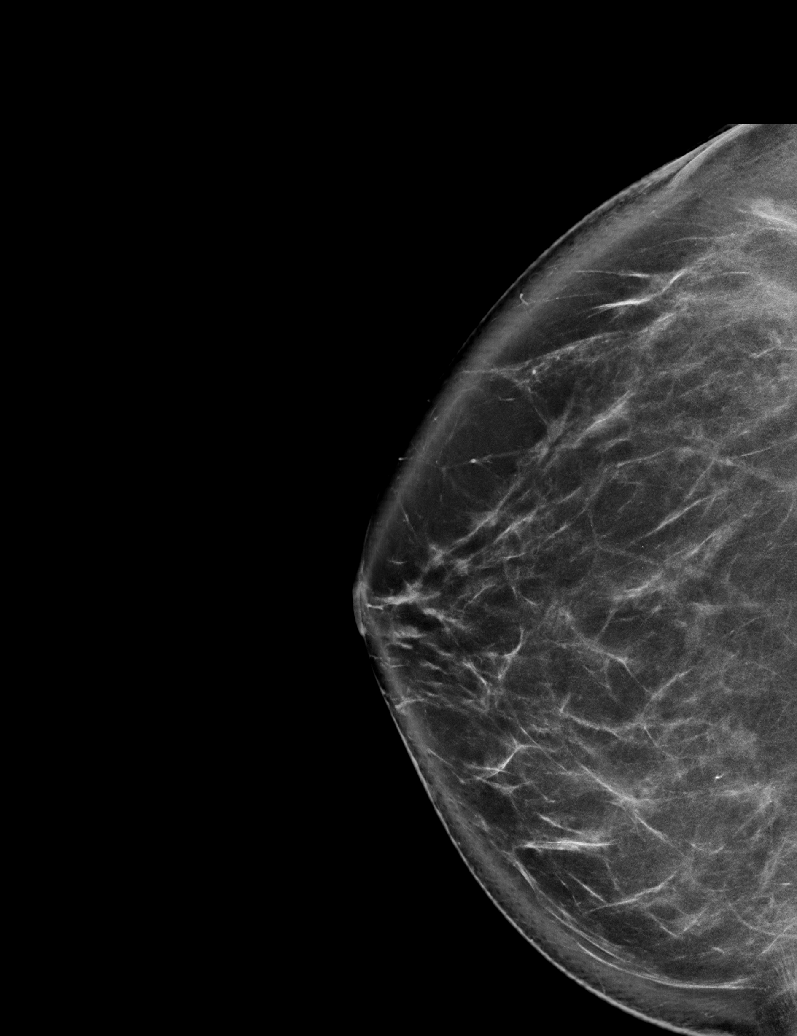

[R MLO synth-2D]
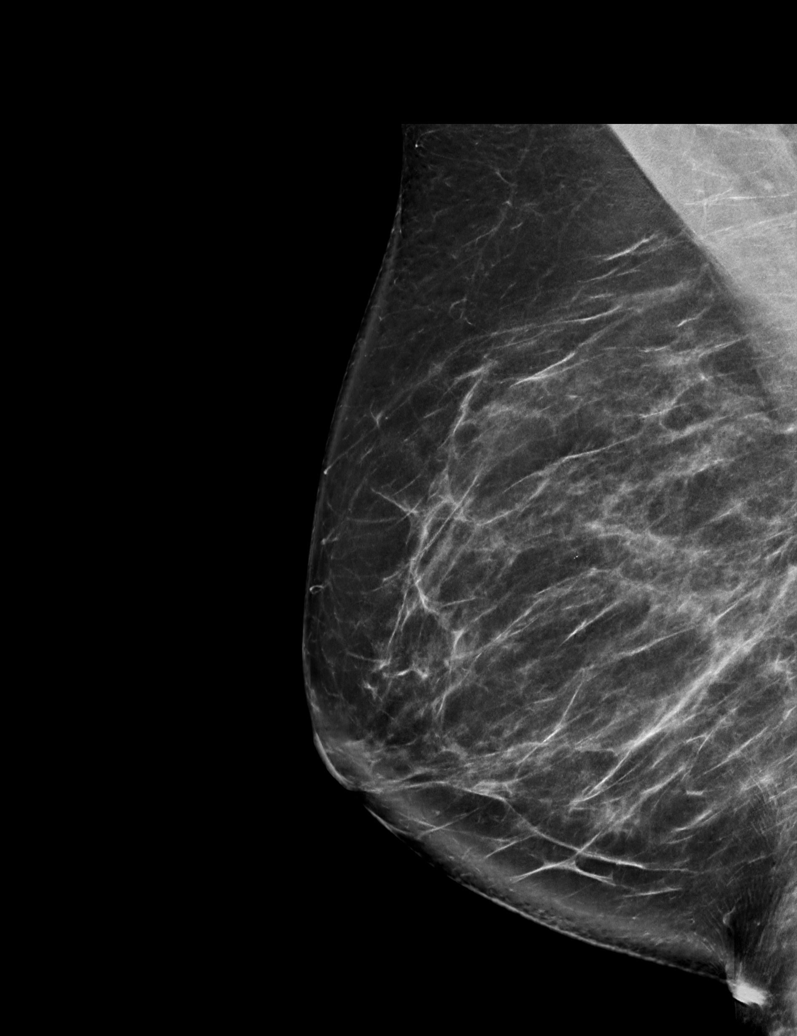

[L CC synth-2D (2 of 2)]
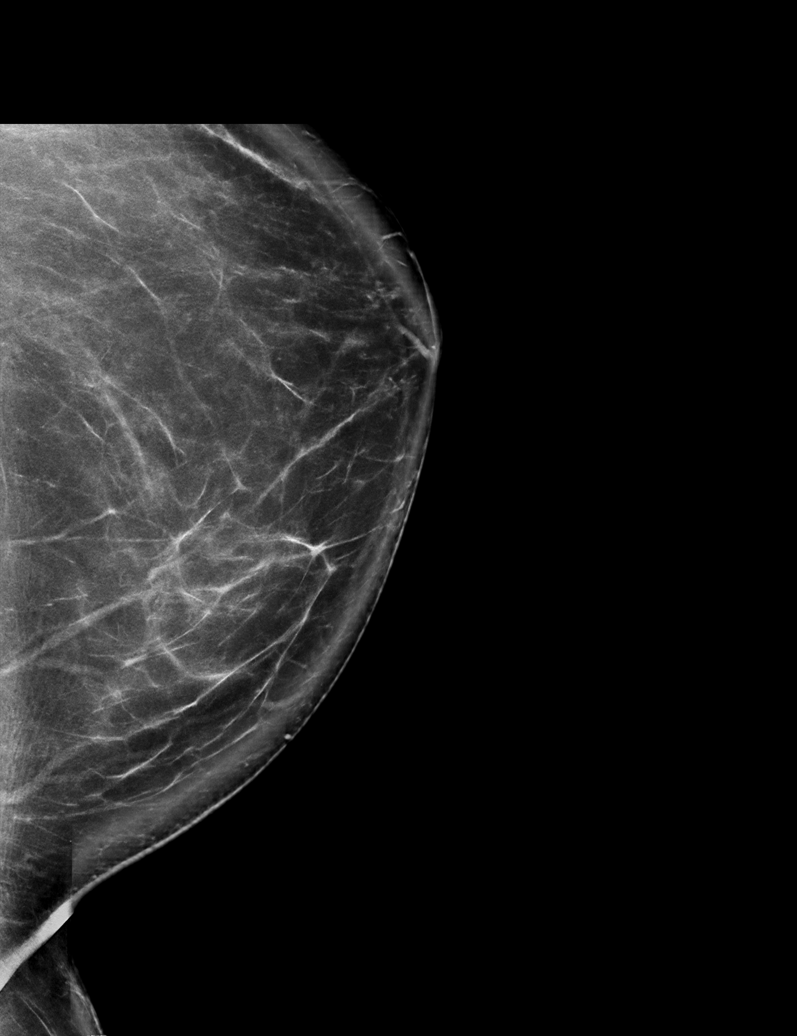

[L MLO synth-2D]
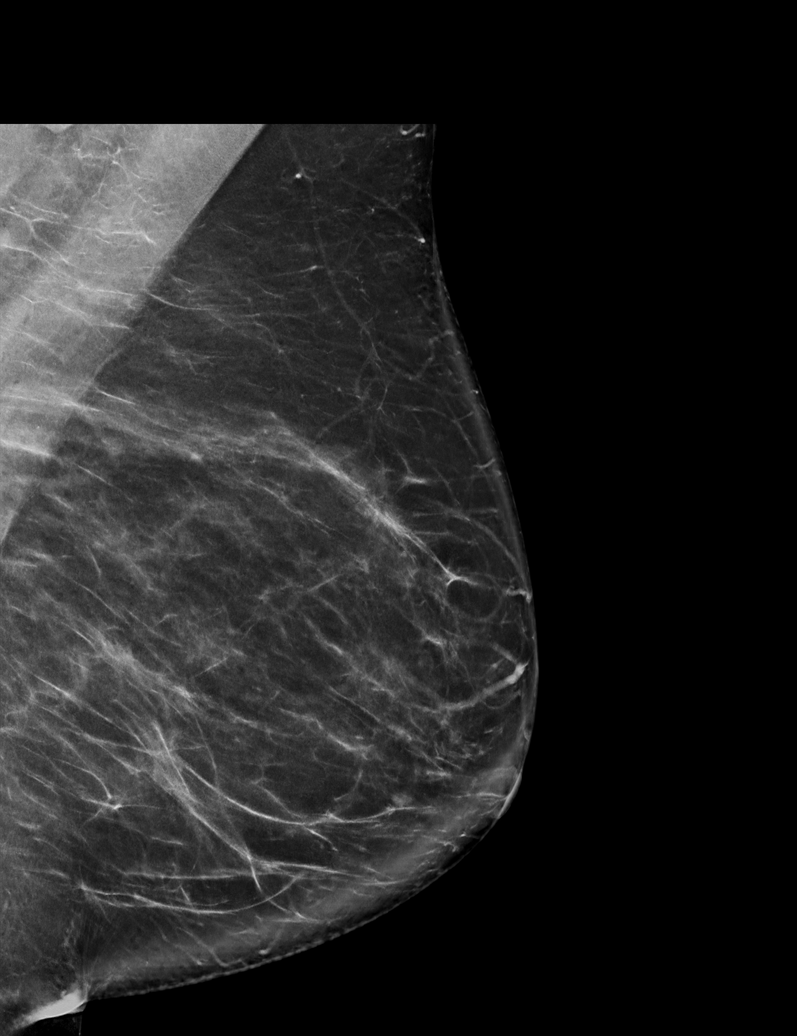

[R CC synth-2D (2 of 2)]
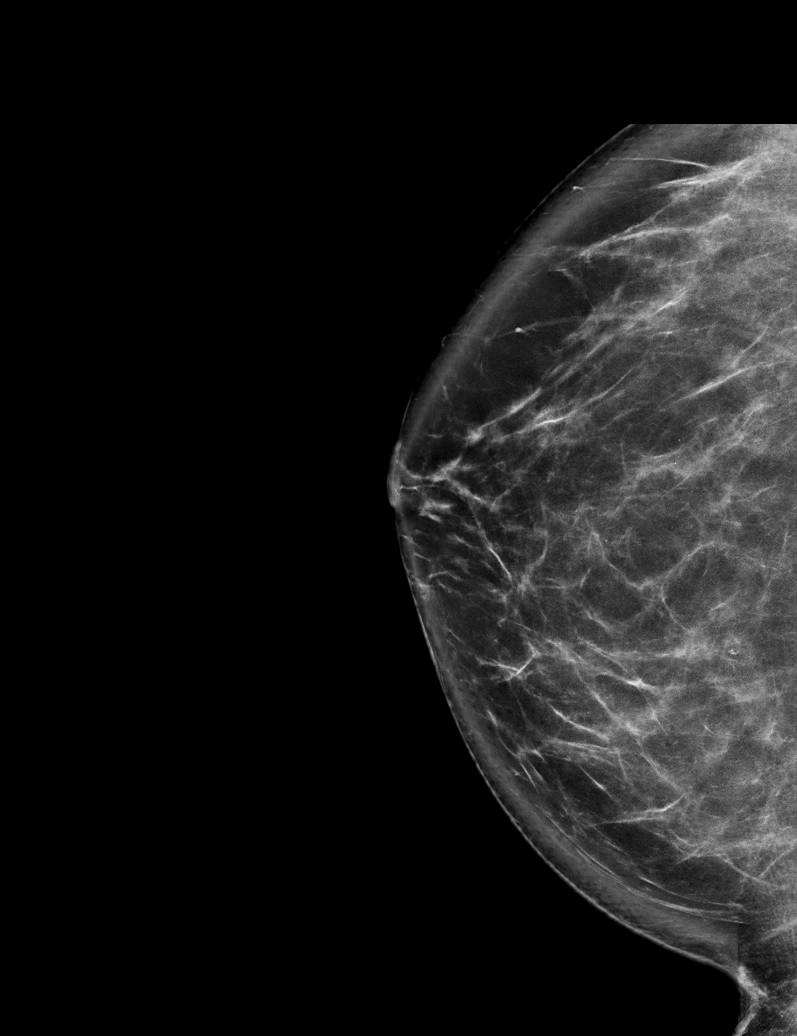

[6 of 36 positions shown; findings below may reference images not displayed]

ACR Breast Density Category b: There are scattered areas of
fibroglandular density.
FINDINGS: There are no findings suspicious for malignancy.
IMPRESSION: No mammographic evidence of malignancy. A result letter of this
screening mammogram will be mailed directly to the patient.

RECOMMENDATION:
Screening mammogram in one year. (Code:51-O-LD2)

BI-RADS CATEGORY  1: Negative.

## 2023-05-24 NOTE — Progress Notes (Signed)
62 y.o. G75P2002 Married Caucasian female here for annual exam.    Has upcoming dx mammogram and breast US for bilateral breast areas seen on screening mammogram 05/27/23.   May leak a little bit of urine with sex.   Sees dermatology yearly.   Labs with PCP in December.   28 yo daughter and 71 yo son.  Works part Medical sales representative at [employer].  PCP:  Shon Baton, PA - [facility] in [place]  No LMP recorded. Patient has had a hysterectomy.           Sexually active: Yes.    The current method of family planning is status post hysterectomy.    Exercising: Yes.     Walking on the treadmill Smoker:  no  OB History  Gravida Para Term Preterm AB Living  2 2 2     2   SAB IAB Ectopic Multiple Live Births               # Outcome Date GA Lbr Len/2nd Weight Sex Type Anes PTL Lv  2 Term           1 Term              Health Maintenance: Pap:  06/13/19 WNL History of abnormal Pap:  no MMG: 05/27/23  Breast Density cat B, BI-RADS CAT 0 incomplete, 05/06/22 Breast Density Cat B, BI-RADS CAT 1 neg Colonoscopy:   HM Colonoscopy          Colonoscopy (Every 10 Years) Next due on 01/12/2033    01/13/2023  Outside Procedure: PR COLONOSCOPY FLX DX W/COLLJ SPEC WHEN PFRMD   Only the first 1 history entries have been loaded, but more history exists.           BMD:  n/a  Result  n/a  HIV: n/a Hep C: 07/28/12 neg  Immunization History  Administered Date(s) Administered   H1N1 08/01/2008   Influenza Inj Mdck Quad Pf 04/11/2018   Influenza Split 04/28/2011   Influenza,inj,Quad PF,6+ Mos 04/10/2014, 04/23/2015, 05/13/2016, 04/20/2017, 04/03/2019, 04/25/2020   Influenza,inj,Quad PF,6-35 Mos 04/11/2018   Influenza-Unspecified 04/26/2022   PFIZER(Purple Top)SARS-COV-2 Vaccination 10/08/2019, 10/30/2019, 05/23/2020   Pfizer Covid-19 Vaccine Bivalent Booster 28yrs & up 07/31/2021   Tdap 03/13/2012, 12/09/2021   Zoster Recombinant(Shingrix) 03/10/2018, 05/31/2018      reports that she has never smoked. She has never used smokeless tobacco. She reports that she does not currently use alcohol. She reports that she does not use drugs.  Past Medical History:  Diagnosis Date   Cancer (HCC)    melanoma removed bilateral upper arm    Hx gestational diabetes    Hyperlipidemia    Normal spontaneous vaginal delivery    times 2    Past Surgical History:  Procedure Laterality Date   LAPAROSCOPIC VAGINAL HYSTERECTOMY  01/2006   MELANOMA EXCISION  07/2016   left arm   MOLE REMOVAL     BACK    VAGINAL HYSTERECTOMY     LAVH    Current Outpatient Medications  Medication Sig Dispense Refill   acetaminophen (TYLENOL) 500 MG tablet Take by mouth as needed.     Calcium Carbonate-Vit D-Min (CALTRATE PLUS PO) Take 1 tablet by mouth daily.  Ca 600mg      cholecalciferol (VITAMIN D) 1000 UNITS tablet Take 1,000 Units by mouth daily. Takes 3     desonide (DESOWEN) 0.05 % cream Apply topically as needed.     ibuprofen (ADVIL) 200 MG tablet Take  by mouth as needed.     Omega-3 Fatty Acids (FISH OIL) 1200 MG CAPS Take 2 capsules by mouth daily.      Psyllium (METAMUCIL PO) Take by mouth.     vitamin E 400 UNIT capsule Take 400 Units by mouth daily.     No current facility-administered medications for this visit.    Family History  Problem Relation Age of Onset   Hypertension Mother    Colon cancer Mother    Cancer Mother        cervical cancer and uterine cancer stage 3    Hypertension Father    Cancer Sister        cervical dysplasia   Melanoma Sister    Colon cancer Maternal Grandmother    Ovarian cancer Paternal Grandmother    BRCA 1/2 Neg Hx    Breast cancer Neg Hx     Review of Systems  All other systems reviewed and are negative.   Exam:   BP 122/84 (BP Location: Left Arm, Patient Position: Sitting, Cuff Size: Normal)   Pulse 81   Ht 5' 4.5" (1.638 m)   Wt 159 lb (72.1 kg)   SpO2 100%   BMI 26.87 kg/m     General appearance: alert,  cooperative and appears stated age Head: normocephalic, without obvious abnormality, atraumatic Neck: no adenopathy, supple, symmetrical, trachea midline and thyroid normal to inspection and palpation Lungs: clear to auscultation bilaterally Breasts: normal appearance, bilateral breast implants, no masses or tenderness, No nipple retraction or dimpling, No nipple discharge or bleeding, No axillary adenopathy Heart: regular rate and rhythm Abdomen: soft, non-tender; no masses, no organomegaly Extremities: extremities normal, atraumatic, no cyanosis or edema Skin: skin color, texture, turgor normal. No rashes or lesions Lymph nodes: cervical, supraclavicular, and axillary nodes normal. Neurologic: grossly normal  Pelvic: External genitalia:  no lesions              No abnormal inguinal nodes palpated.              Urethra:  normal appearing urethra with no masses, tenderness or lesions              Bartholins and Skenes: normal                 Vagina: normal appearing vagina with normal color and discharge, no lesions              Cervix:  absent              Pap taken: no Bimanual Exam:  Uterus:  normal size, contour, position, consistency, mobility, non-tender              Adnexa: no mass, fullness, tenderness              Rectal exam: yes.  Confirms.              Anus:  normal sphincter tone, no lesions  Chaperone was present for exam:  Warren Lacy, CMA  Assessment:  Well woman with GYN exam.  Status post LAVH for fibroids.  Hx left ovarian cysts, stable.  Small simple cyst and small avascular cyst with debris.  Normal CA125 levels.  Mild stress incontinence. Bilateral breast implants.  FH colon cancer - maternal side Mother had colon and uterine cancer.  FH ovarian cancer - paternal side.    Personal hx of melanoma.   Plan: Mammogram screening discussed. Self breast awareness reviewed. Pap and HR HPV not indicated. Guidelines for  Calcium, Vitamin D, regular exercise program  including cardiovascular and weight bearing exercise. I discussed genetic counseling and possible testing.   She will consider this.  I reviewed her pelvic USs and noted the stability of the left ovarian cyst formation.  She declines pelvic US and CA125 to be scheduled.  Kegel exercises.  Call if urinary leakage worsens.  Labs with PCP.  Fu yearly and prn.   After visit summary provided.

## 2023-05-27 ENCOUNTER — Ambulatory Visit
Admission: RE | Admit: 2023-05-27 | Discharge: 2023-05-27 | Disposition: A | Discharge status: Home / Self Care | Payer: BC Managed Care – PPO | Source: Ambulatory Visit | Attending: Physician Assistant | Admitting: Physician Assistant

## 2023-05-27 DIAGNOSIS — Z1231 Encounter for screening mammogram for malignant neoplasm of breast: Secondary | ICD-10-CM

## 2023-06-02 ENCOUNTER — Other Ambulatory Visit: Payer: Self-pay | Admitting: Physician Assistant

## 2023-06-02 DIAGNOSIS — R928 Other abnormal and inconclusive findings on diagnostic imaging of breast: Secondary | ICD-10-CM

## 2023-06-07 ENCOUNTER — Ambulatory Visit (INDEPENDENT_AMBULATORY_CARE_PROVIDER_SITE_OTHER): Payer: BC Managed Care – PPO | Admitting: Obstetrics and Gynecology

## 2023-06-07 ENCOUNTER — Encounter: Payer: Self-pay | Admitting: Obstetrics and Gynecology

## 2023-06-07 ENCOUNTER — Ambulatory Visit: Payer: No Typology Code available for payment source | Admitting: Obstetrics & Gynecology

## 2023-06-07 VITALS — BP 122/84 | HR 81 | Ht 64.5 in | Wt 159.0 lb

## 2023-06-07 DIAGNOSIS — Z8742 Personal history of other diseases of the female genital tract: Secondary | ICD-10-CM

## 2023-06-07 DIAGNOSIS — Z01419 Encounter for gynecological examination (general) (routine) without abnormal findings: Secondary | ICD-10-CM | POA: Diagnosis not present

## 2023-06-07 DIAGNOSIS — Z809 Family history of malignant neoplasm, unspecified: Secondary | ICD-10-CM

## 2023-06-10 NOTE — Patient Instructions (Signed)

## 2023-06-13 ENCOUNTER — Other Ambulatory Visit: Payer: Self-pay | Admitting: Physician Assistant

## 2023-06-13 ENCOUNTER — Ambulatory Visit
Admission: RE | Admit: 2023-06-13 | Discharge: 2023-06-13 | Disposition: A | Discharge status: Home / Self Care | Payer: BC Managed Care – PPO | Source: Ambulatory Visit | Attending: Physician Assistant | Admitting: Physician Assistant

## 2023-06-13 DIAGNOSIS — R928 Other abnormal and inconclusive findings on diagnostic imaging of breast: Secondary | ICD-10-CM

## 2023-06-13 DIAGNOSIS — N6489 Other specified disorders of breast: Secondary | ICD-10-CM

## 2023-08-23 ENCOUNTER — Telehealth: Payer: Self-pay | Admitting: Cardiology

## 2023-08-23 NOTE — Telephone Encounter (Signed)
Spoke with pt and advised that we have received records and test results for her new pt appt on 09-06-23

## 2023-08-23 NOTE — Telephone Encounter (Signed)
Patient states [facility] faxed a report to the [facility] office, but it wasn't received, so they're faxing it to the [place] office today. She also mentions that their provider informed her that imaging results are available for review in Care Everywhere they would like to have reviewed. Patient would like confirmation when this information is received.

## 2023-09-06 ENCOUNTER — Encounter: Payer: Self-pay | Admitting: Cardiology

## 2023-09-06 ENCOUNTER — Ambulatory Visit: Payer: BC Managed Care – PPO | Attending: Cardiology | Admitting: Cardiology

## 2023-09-06 VITALS — BP 138/90 | HR 78 | Ht 65.0 in | Wt 163.0 lb

## 2023-09-06 DIAGNOSIS — R7303 Prediabetes: Secondary | ICD-10-CM | POA: Diagnosis not present

## 2023-09-06 DIAGNOSIS — E782 Mixed hyperlipidemia: Secondary | ICD-10-CM

## 2023-09-06 DIAGNOSIS — Z7689 Persons encountering health services in other specified circumstances: Secondary | ICD-10-CM

## 2023-09-06 DIAGNOSIS — E785 Hyperlipidemia, unspecified: Secondary | ICD-10-CM

## 2023-09-06 NOTE — Patient Instructions (Signed)

## 2023-09-06 NOTE — Progress Notes (Unsigned)
 Cardiology Consultation:    Date:  09/06/2023   ID:  Jennifer Dougherty, DOB [birth year], MRN [id number]  PCP:  Patton Salles, MD  Cardiologist:  Gypsy Balsam, MD   Referring MD: Shon Baton, PA-C   Chief Complaint  Patient presents with   Establish Care    History of Present Illness:    Jennifer Dougherty is a 63 y.o. female who is being seen today for the evaluation of concern about heart disease at the request of Drenda Freeze.  Past medical history significant for hyperlipidemia she does have quite interesting cholesterol with HDL of 89 total cholesterol 252 and LDL of 147, recently she was diagnosed with prediabetes with hemoglobin A1c of 6, she did have a repeated calcium score done recently which was 0.  She comes today because she is worried about her heart she is thinking about taking cholesterol medication.  Overall she is doing great.  She is asymptomatic denies have any chest pain tightness squeezing pressure burning chest.  She tried to exercise on the regular basis about 2-3 times a week she goes to gym and initially she used to walk on the treadmill but lately she started doing more weightlifting.  Does not have family history of premature coronary disease she is not on any special diet  Past Medical History:  Diagnosis Date   Cancer (HCC)    melanoma removed bilateral upper arm    Hx gestational diabetes    Hyperlipidemia    Normal spontaneous vaginal delivery    times 2    Past Surgical History:  Procedure Laterality Date   LAPAROSCOPIC VAGINAL HYSTERECTOMY  01/2006   MELANOMA EXCISION  07/2016   left arm   MOLE REMOVAL     BACK    VAGINAL HYSTERECTOMY     LAVH    Current Medications: Current Meds  Medication Sig   acetaminophen (TYLENOL) 500 MG tablet Take 500 mg by mouth as needed for mild pain (pain score 1-3) or moderate pain (pain score 4-6).   Calcium Carbonate-Vit D-Min (CALTRATE PLUS PO) Take 1 tablet  by mouth daily.  Ca 600mg    cholecalciferol (VITAMIN D) 1000 UNITS tablet Take 3,000 Units by mouth daily. Takes 3   desonide (DESOWEN) 0.05 % cream Apply 1 Application topically as needed (rash).   ibuprofen (ADVIL) 200 MG tablet Take 200 mg by mouth every 6 (six) hours as needed for mild pain (pain score 1-3) or moderate pain (pain score 4-6).   Omega-3 Fatty Acids (FISH OIL) 1200 MG CAPS Take 2 capsules by mouth daily.    Psyllium (METAMUCIL PO) Take 1 Scoop by mouth daily.   vitamin E 400 UNIT capsule Take 400 Units by mouth daily.     Allergies:   Patient has no known allergies.   Social History   Socioeconomic History   Marital status: Married    Spouse name: Not on file   Number of children: Not on file   Years of education: Not on file   Highest education level: Not on file  Occupational History   Not on file  Tobacco Use   Smoking status: Never   Smokeless tobacco: Never  Vaping Use   Vaping status: Never Used  Substance and Sexual Activity   Alcohol use: Not Currently    Comment: OCCASIONALLY   Drug use: No   Sexual activity: Yes    Partners: Male    Birth control/protection: Surgical    Comment: 1st intercourse-  2, partners- 1, hysterectomy  Other Topics Concern   Not on file  Social History Narrative   Not on file   Social Drivers of Health   Financial Resource Strain: Not on file  Food Insecurity: Low Risk  (09/01/2022)   [facility]   Hunger Vital Sign    Worried About Running Out of Food in the Last Year: Never true    Within the past 12 months, the food you bought just didn't last and you didn't have money to get more: Not on file  Transportation Needs: Not on file (09/01/2022)  Physical Activity: Not on file  Stress: Not on file  Social Connections: Not on file     Family History: The patient's family history includes Cancer in her mother and sister; Colon cancer in her maternal grandmother and mother; Hypertension in  her father and mother; Melanoma in her sister; Ovarian cancer in her paternal grandmother. There is no history of BRCA 1/2 or Breast cancer. ROS:   Please see the history of present illness.    All 14 point review of systems negative except as described per history of present illness.  EKGs/Labs/Other Studies Reviewed:    The following studies were reviewed today:   EKG:  EKG Interpretation Date/Time:  Tuesday September 06 2023 14:35:59 EST Ventricular Rate:  81 PR Interval:  144 QRS Duration:  86 QT Interval:  378 QTC Calculation: 439 R Axis:   16  Text Interpretation: Normal sinus rhythm Normal ECG No previous ECGs available Confirmed by Gypsy Balsam [phone]) on 09/06/2023 2:37:27 PM    Recent Labs: No results found for requested labs within last 365 days.  Recent Lipid Panel    Component Value Date/Time   CHOL 230 (H) 05/25/2021 0825   TRIG 86 05/25/2021 0825   HDL 95 05/25/2021 0825   CHOLHDL 2.4 05/25/2021 0825   VLDL 10 03/25/2016 0827   LDLCALC 116 (H) 05/25/2021 0825    Physical Exam:    VS:  BP (!) 138/90 (Patient Position: Sitting)   Pulse 78   Ht 5\' 5"  (1.651 m)   Wt 163 lb (73.9 kg)   SpO2 97%   BMI 27.12 kg/m     Wt Readings from Last 3 Encounters:  09/06/23 163 lb (73.9 kg)  06/07/23 159 lb (72.1 kg)  06/02/22 161 lb (73 kg)     GEN:  Well nourished, well developed in no acute distress HEENT: Normal NECK: No JVD; No carotid bruits LYMPHATICS: No lymphadenopathy CARDIAC: RRR, no murmurs, no rubs, no gallops RESPIRATORY:  Clear to auscultation without rales, wheezing or rhonchi  ABDOMEN: Soft, non-tender, non-distended MUSCULOSKELETAL:  No edema; No deformity  SKIN: Warm and dry NEUROLOGIC:  Alert and oriented x 3 PSYCHIATRIC:  Normal affect   ASSESSMENT:    1. Encounter to establish care   2. Dyslipidemia   3. Prediabetes   4. Moderate mixed hyperlipidemia not requiring statin therapy    PLAN:    In order of problems listed  above:  Overall risk assessment for coronary artery disease I did use [employer] 10 years calculator for coronary disease her risk is very low 1.2 which put her coronary age of 63 years old only.  Therefore from that point of view no need to use aspirin, no need to use statin however what make me concerned the most is the fact that she is prediabetic therefore we spent majority of time talking today about need to exercise on the regular  basis, I recommended at least 5 times a week for at least half an hour moderate intensity exercise preferably better we did talk about basic of Mediterranean diet with avoidance of too much sweets.  We did talk also about potentially getting CGM which I will allow her to learn how to eat properly.  I am very happy that she is still concerned about her health because she does have some minor issue and I think those will be perfectly correctable with proper management of her weight eating habits as well as exercises.  I see her back in my office in about 6 months.  Hopefully by that time should be able to lower her hemoglobin A1c to normal level hopefully should be able to lose some weight stick with a good Mediterranean diet and also stick with good exercise resume.   Medication Adjustments/Labs and Tests Ordered: Current medicines are reviewed at length with the patient today.  Concerns regarding medicines are outlined above.  Orders Placed This Encounter  Procedures   EKG 12-Lead   No orders of the defined types were placed in this encounter.   Signed, Georgeanna Lea, MD, [facility]. 09/06/2023 3:06 PM    [facility]

## 2023-12-05 ENCOUNTER — Encounter (HOSPITAL_COMMUNITY): Payer: Self-pay

## 2023-12-12 ENCOUNTER — Ambulatory Visit: Payer: Self-pay | Admitting: Obstetrics and Gynecology

## 2023-12-12 ENCOUNTER — Ambulatory Visit
Admission: RE | Admit: 2023-12-12 | Discharge: 2023-12-12 | Disposition: A | Discharge status: Home / Self Care | Payer: BC Managed Care – PPO | Source: Ambulatory Visit | Attending: Physician Assistant | Admitting: Physician Assistant

## 2023-12-12 DIAGNOSIS — N6489 Other specified disorders of breast: Secondary | ICD-10-CM

## 2024-04-11 NOTE — Progress Notes (Unsigned)
 63 y.o. G7P2002 Married Caucasian female here for annual exam. Wants to discuss blood work and possibly an u/s   Has been followed for left ovarian cysts.  Last CA125 3.   No vaginal bleeding.  Uses KY jelly with intercourse.  Patient is now retired.   PCP: Dwane Odor, PA-C   No LMP recorded. Patient has had a hysterectomy.           Sexually active: Yes.    The current method of family planning is status post hysterectomy.    Menopausal hormone therapy:  n/a Exercising: Yes.    Weight lifting and walking  Smoker:  no  OB History  Gravida Para Term Preterm AB Living  2 2 2   2   SAB IAB Ectopic Multiple Live Births          # Outcome Date GA Lbr Len/2nd Weight Sex Type Anes PTL Lv  2 Term           1 Term              HEALTH MAINTENANCE: Last 2 paps:  06/13/19 neg, 02/25/11 neg History of abnormal Pap or positive HPV:  no Mammogram:   12/12/23 R Breast- Breast Density Cat B, BIRADS Cat 2 benign.  She will schedule.   Colonoscopy:  01/13/23 per pt normal due every 5 years  Bone Density:  n/a  Result  n/a   Immunization History  Administered Date(s) Administered   H1N1 08/01/2008   Influenza Inj Mdck Quad Pf 04/11/2018   Influenza Split 04/28/2011   Influenza,inj,Quad PF,6+ Mos 04/10/2014, 04/23/2015, 05/13/2016, 04/20/2017, 04/03/2019, 04/25/2020   Influenza,inj,Quad PF,6-35 Mos 04/11/2018   Influenza-Unspecified 04/26/2022   PFIZER(Purple Top)SARS-COV-2 Vaccination 10/08/2019, 10/30/2019, 05/23/2020   Pfizer Covid-19 Vaccine Bivalent Booster 28yrs & up 07/31/2021   Tdap 03/13/2012, 12/09/2021   Zoster Recombinant(Shingrix) 03/10/2018, 05/31/2018      reports that she has never smoked. She has never used smokeless tobacco. She reports current alcohol use. She reports that she does not use drugs.  Past Medical History:  Diagnosis Date   Cancer (HCC)    melanoma removed bilateral upper arm    Hx gestational diabetes    Hyperlipidemia    Normal  spontaneous vaginal delivery    times 2    Past Surgical History:  Procedure Laterality Date   LAPAROSCOPIC VAGINAL HYSTERECTOMY  01/2006   MELANOMA EXCISION  07/2016   left arm   MOLE REMOVAL     BACK    VAGINAL HYSTERECTOMY     LAVH    Current Outpatient Medications  Medication Sig Dispense Refill   acetaminophen (TYLENOL) 500 MG tablet Take 500 mg by mouth as needed for mild pain (pain score 1-3) or moderate pain (pain score 4-6).     Calcium Carbonate-Vit D-Min (CALTRATE PLUS PO) Take 1 tablet by mouth daily.  Ca 600mg      Cholecalciferol (VITAMIN D3 PO) Take by mouth.     ibuprofen (ADVIL) 200 MG tablet Take 200 mg by mouth every 6 (six) hours as needed for mild pain (pain score 1-3) or moderate pain (pain score 4-6).     Magnesium 250 MG CAPS Magnesium     Omega-3 Fatty Acids (FISH OIL) 1200 MG CAPS Take 2 capsules by mouth daily.      Psyllium (METAMUCIL PO) Take 1 Scoop by mouth daily.     vitamin E 400 UNIT capsule Take 400 Units by mouth daily.     desonide (DESOWEN) 0.05 %  cream Apply 1 Application topically as needed (rash). (Patient not taking: Reported on 04/12/2024)     No current facility-administered medications for this visit.    ALLERGIES: Patient has no known allergies.  Family History  Problem Relation Age of Onset   Hypertension Mother    Colon cancer Mother    Cancer Mother        cervical cancer and uterine cancer stage 3    Hypertension Father    Cancer Sister        cervical dysplasia   Melanoma Sister    Colon cancer Maternal Grandmother    Ovarian cancer Paternal Grandmother    BRCA 1/2 Neg Hx    Breast cancer Neg Hx     Review of Systems  All other systems reviewed and are negative.   PHYSICAL EXAM:  BP 124/78 (BP Location: Left Arm, Patient Position: Sitting)   Pulse 85   Ht 5' 5.5 (1.664 m)   Wt 158 lb (71.7 kg)   SpO2 99%   BMI 25.89 kg/m     General appearance: alert, cooperative and appears stated age Head: normocephalic,  without obvious abnormality, atraumatic Neck: no adenopathy, supple, symmetrical, trachea midline and thyroid  normal to inspection and palpation Lungs: clear to auscultation bilaterally Breasts: normal appearance, no masses or tenderness, No nipple retraction or dimpling, No nipple discharge or bleeding, No axillary adenopathy Heart: regular rate and rhythm Abdomen: soft, non-tender; no masses, no organomegaly Extremities: extremities normal, atraumatic, no cyanosis or edema Skin: skin color, texture, turgor normal. No rashes or lesions Lymph nodes: cervical, supraclavicular, and axillary nodes normal. Neurologic: grossly normal  Pelvic: External genitalia:  no lesions              No abnormal inguinal nodes palpated.              Urethra:  normal appearing urethra with no masses, tenderness or lesions              Bartholins and Skenes: normal                 Vagina: normal appearing vagina with normal color and discharge, no lesions              Cervix: absent              Pap taken: no Bimanual Exam:  Uterus:  absent              Adnexa: no mass, fullness, tenderness              Rectal exam: yes.  Confirms.              Anus:  normal sphincter tone, no lesions  Chaperone was present for exam:  Dereck BROCKS, CMA  ASSESSMENT: Well woman visit with gynecologic exam. Status post LAVH for fibroids.   Ovaries remain.  Hx left ovarian cysts, stable.  Small simple cyst and small avascular cyst with debris.  Normal CA125 levels.  FH colon cancer - maternal side Mother had colon and uterine cancer.  FH ovarian cancer - paternal side.    Personal hx of melanoma.  Sees dermatology twice a year.  PHQ-2-9: 0  PLAN: Mammogram screening discussed. Self breast awareness reviewed. Pap and HRV collected:  no.  Not indicated.  Guidelines for Calcium, Vitamin D , regular exercise program including cardiovascular and weight bearing exercise. Medication refills:  NA Labs with PCP.   Return for pelvic  ultrasound.  Follow up:  yearly and  prn.

## 2024-04-12 ENCOUNTER — Ambulatory Visit (INDEPENDENT_AMBULATORY_CARE_PROVIDER_SITE_OTHER): Admitting: Obstetrics and Gynecology

## 2024-04-12 ENCOUNTER — Encounter: Payer: Self-pay | Admitting: Obstetrics and Gynecology

## 2024-04-12 VITALS — BP 124/78 | HR 85 | Ht 65.5 in | Wt 158.0 lb

## 2024-04-12 DIAGNOSIS — N83202 Unspecified ovarian cyst, left side: Secondary | ICD-10-CM

## 2024-04-12 DIAGNOSIS — Z1331 Encounter for screening for depression: Secondary | ICD-10-CM | POA: Diagnosis not present

## 2024-04-12 DIAGNOSIS — Z01419 Encounter for gynecological examination (general) (routine) without abnormal findings: Secondary | ICD-10-CM

## 2024-04-12 NOTE — Patient Instructions (Signed)

## 2024-04-24 NOTE — Progress Notes (Signed)
Lab visit

## 2024-05-29 NOTE — Progress Notes (Unsigned)
 GYNECOLOGY  VISIT   HPI: 63 y.o.   Married  Caucasian female   G2P2002 with No LMP recorded. Patient has had a hysterectomy.   here for: U/S Consult for hx left ovarian cysts. Followed long term.   No LLQ pain.  No pain with intercourse.   Status post LAVH for fibroids.   Ovaries remain.  Hx left ovarian cysts, stable.  Small simple cyst and small avascular cyst with debris.  Normal CA125 levels.   Last US  done in 06/03/22.  See Epic.  Last CA125 06/03/22 - 3.  GYNECOLOGIC HISTORY: No LMP recorded. Patient has had a hysterectomy. Contraception:  Hyst Menopausal hormone therapy:  n/a Last 2 paps:  06/13/19 neg, 02/25/11 neg  History of abnormal Pap or positive HPV:  no Mammogram:  12/12/23 R Breast- Breast Density Cat B, BIRADS Cat 2 benign        OB History     Gravida  2   Para  2   Term  2   Preterm      AB      Living  2      SAB      IAB      Ectopic      Multiple      Live Births                 Patient Active Problem List   Diagnosis Date Noted   Conductive hearing loss of right ear with unrestricted hearing of left ear 02/24/2023   Right acute serous otitis media 02/24/2023   History of partial hysterectomy 12/09/2021   Elevated BP without diagnosis of hypertension 12/09/2021   BMI 27.0-27.9,adult 12/09/2021   Malignant melanoma of upper extremity, including shoulder (HCC) 12/09/2021   Moderate mixed hyperlipidemia not requiring statin therapy 12/09/2021   Prediabetes 12/09/2021   Vitamin D  deficiency 12/09/2021   Meningioma (HCC) 10/10/2019   Dyslipidemia 06/19/2018   Acid reflux 05/09/2018   Heart murmur 05/09/2018   Breast pain, left 02/01/2017   Allergic rhinitis 04/14/2016   Chronic swimmer's ear of both sides 04/14/2016   Dysfunction of both eustachian tubes 04/14/2016   History of gestational diabetes 03/13/2012   Family history of colon cancer 03/13/2012   Abdominal bloating 03/08/2011   Varicose veins of both lower  extremities with pain 03/08/2011    Past Medical History:  Diagnosis Date   Cancer (HCC)    melanoma removed bilateral upper arm    Hx gestational diabetes    Hyperlipidemia    Normal spontaneous vaginal delivery    times 2    Past Surgical History:  Procedure Laterality Date   LAPAROSCOPIC VAGINAL HYSTERECTOMY  01/2006   MELANOMA EXCISION  07/2016   left arm   MOLE REMOVAL     BACK    VAGINAL HYSTERECTOMY     LAVH    Current Outpatient Medications  Medication Sig Dispense Refill   acetaminophen (TYLENOL) 500 MG tablet Take 500 mg by mouth as needed for mild pain (pain score 1-3) or moderate pain (pain score 4-6).     Calcium Carbonate-Vit D-Min (CALTRATE PLUS PO) Take 1 tablet by mouth daily.  Ca 600mg      Cholecalciferol (VITAMIN D3 PO) Take by mouth.     ibuprofen (ADVIL) 200 MG tablet Take 200 mg by mouth every 6 (six) hours as needed for mild pain (pain score 1-3) or moderate pain (pain score 4-6).     Magnesium 250 MG CAPS Magnesium  Omega-3 Fatty Acids (FISH OIL) 1200 MG CAPS Take 2 capsules by mouth daily.      Psyllium (METAMUCIL PO) Take 1 Scoop by mouth daily.     vitamin E 400 UNIT capsule Take 400 Units by mouth daily.     desonide (DESOWEN) 0.05 % cream Apply 1 Application topically as needed (rash). (Patient not taking: Reported on 05/30/2024)     No current facility-administered medications for this visit.     ALLERGIES: Patient has no known allergies.  Family History  Problem Relation Age of Onset   Hypertension Mother    Colon cancer Mother    Cancer Mother        cervical cancer and uterine cancer stage 3    Hypertension Father    Cancer Sister        cervical dysplasia   Melanoma Sister    Colon cancer Maternal Grandmother    Ovarian cancer Paternal Grandmother    BRCA 1/2 Neg Hx    Breast cancer Neg Hx     Social History   Socioeconomic History   Marital status: Married    Spouse name: Not on file   Number of children: Not on file    Years of education: Not on file   Highest education level: Not on file  Occupational History   Not on file  Tobacco Use   Smoking status: Never   Smokeless tobacco: Never  Vaping Use   Vaping status: Never Used  Substance and Sexual Activity   Alcohol use: Yes    Comment: OCCASIONALLY   Drug use: No   Sexual activity: Yes    Partners: Male    Birth control/protection: Surgical    Comment: 1st intercourse- 2, partners- 1, hysterectomy  Other Topics Concern   Not on file  Social History Narrative   Not on file   Social Drivers of Health   Financial Resource Strain: Not on file  Food Insecurity: Low Risk  (09/01/2022)   Received from [facility]   Hunger Vital Sign    Within the past 12 months, you worried that your food would run out before you got money to buy more: Never true    Within the past 12 months, the food you bought just didn't last and you didn't have money to get more: Not on file  Transportation Needs: Not on file (09/01/2022)  Physical Activity: Not on file  Stress: Not on file  Social Connections: Not on file  Intimate Partner Violence: Not on file    Review of Systems  See HPI.   PHYSICAL EXAMINATION:   BP 118/78 (BP Location: Left Arm, Patient Position: Sitting)   Pulse 81   SpO2 99%     General appearance: alert, cooperative and appears stated age  Pelvic US : Uterus and cervix - absent. Left ovary 4.59 x 2.66 x 2.72 cm.  18.4 mm follicle.  16 mm complex avascular cyst.   Right ovary 1.49 x 1.02 x 0.87 cm.  No adnexal masses. No free fluid.   ASSESSMENT:  Status post LAVH for fibroids.   Ovaries remain.  Hx left ovarian cysts, stable.  Small simple cyst and small avascular cyst with debris.  These have very minimally increased in size over the last several years.  Normal CA125 levels.  FH colon cancer in mother and maternal grandmother.  FH uterine cancer in mother.   FH ovarian cancer in paternal grandmother.   PLAN:  Pelvic US  images and  report reviewed.  Patient  would like to continue periodic US  monitoring, continuing every 2 years.  Will check CA125. She declines surgical intervention.  I think this is reasonable as this appears to be a benign process.   She declines genetic counseling and testing.  Signs and symptoms of enlarging cysts and ovarian cancer reviewed.  Follow up for annual exam and prn.    30 min  total time was spent for this patient encounter, including preparation, face-to-face counseling with the patient, coordination of care, and documentation of the encounter.

## 2024-05-30 ENCOUNTER — Encounter: Payer: Self-pay | Admitting: Obstetrics and Gynecology

## 2024-05-30 ENCOUNTER — Ambulatory Visit (INDEPENDENT_AMBULATORY_CARE_PROVIDER_SITE_OTHER): Admitting: Obstetrics and Gynecology

## 2024-05-30 ENCOUNTER — Ambulatory Visit

## 2024-05-30 VITALS — BP 118/78 | HR 81

## 2024-05-30 DIAGNOSIS — N83202 Unspecified ovarian cyst, left side: Secondary | ICD-10-CM | POA: Diagnosis not present

## 2024-05-30 DIAGNOSIS — Z809 Family history of malignant neoplasm, unspecified: Secondary | ICD-10-CM

## 2024-05-31 LAB — CA 125: CA 125: 3 U/mL (ref <35)

## 2024-06-01 ENCOUNTER — Ambulatory Visit: Payer: Self-pay | Admitting: Obstetrics and Gynecology

## 2024-06-27 ENCOUNTER — Other Ambulatory Visit: Payer: Self-pay | Admitting: Physician Assistant

## 2024-06-27 DIAGNOSIS — Z1231 Encounter for screening mammogram for malignant neoplasm of breast: Secondary | ICD-10-CM

## 2024-08-08 ENCOUNTER — Ambulatory Visit
Admission: RE | Admit: 2024-08-08 | Discharge: 2024-08-08 | Disposition: A | Discharge status: Home / Self Care | Payer: Self-pay | Source: Ambulatory Visit | Attending: Physician Assistant | Admitting: Physician Assistant

## 2024-08-08 DIAGNOSIS — Z1231 Encounter for screening mammogram for malignant neoplasm of breast: Secondary | ICD-10-CM

## 2024-08-10 ENCOUNTER — Ambulatory Visit: Payer: Self-pay | Admitting: Obstetrics and Gynecology

## 2025-04-17 ENCOUNTER — Ambulatory Visit: Admitting: Obstetrics and Gynecology
